# Patient Record
Sex: Female | Born: 1980 | Race: Asian | Hispanic: No | Marital: Married | Smoking: Never smoker
Health system: Southern US, Community
[De-identification: ages and names within clinical notes are randomized; demographics above are authoritative.]

## PROBLEM LIST (undated history)

## (undated) DIAGNOSIS — Z789 Other specified health status: Secondary | ICD-10-CM

## (undated) DIAGNOSIS — M2550 Pain in unspecified joint: Secondary | ICD-10-CM

## (undated) DIAGNOSIS — M25469 Effusion, unspecified knee: Secondary | ICD-10-CM

## (undated) DIAGNOSIS — J45909 Unspecified asthma, uncomplicated: Secondary | ICD-10-CM

## (undated) DIAGNOSIS — M25429 Effusion, unspecified elbow: Secondary | ICD-10-CM

## (undated) DIAGNOSIS — F419 Anxiety disorder, unspecified: Secondary | ICD-10-CM

## (undated) DIAGNOSIS — Z975 Presence of (intrauterine) contraceptive device: Secondary | ICD-10-CM

## (undated) DIAGNOSIS — T7840XA Allergy, unspecified, initial encounter: Secondary | ICD-10-CM

## (undated) HISTORY — DX: Allergy, unspecified, initial encounter: T78.40XA

## (undated) HISTORY — DX: Presence of (intrauterine) contraceptive device: Z97.5

## (undated) HISTORY — PX: NO PAST SURGERIES: SHX2092

## (undated) HISTORY — PX: LASIK: SHX215

---

## 2006-09-15 ENCOUNTER — Emergency Department: Payer: Self-pay | Admitting: Emergency Medicine

## 2006-09-25 ENCOUNTER — Ambulatory Visit: Payer: Self-pay | Admitting: Family Medicine

## 2007-07-30 ENCOUNTER — Observation Stay: Payer: Self-pay

## 2010-07-26 ENCOUNTER — Emergency Department: Payer: Self-pay | Admitting: Emergency Medicine

## 2013-06-05 ENCOUNTER — Emergency Department: Payer: Self-pay | Admitting: Emergency Medicine

## 2014-10-21 ENCOUNTER — Ambulatory Visit (INDEPENDENT_AMBULATORY_CARE_PROVIDER_SITE_OTHER): Payer: No Typology Code available for payment source | Admitting: Family Medicine

## 2014-12-06 ENCOUNTER — Encounter: Payer: Self-pay | Admitting: Family Medicine

## 2014-12-06 ENCOUNTER — Ambulatory Visit (INDEPENDENT_AMBULATORY_CARE_PROVIDER_SITE_OTHER): Payer: No Typology Code available for payment source | Admitting: Family Medicine

## 2014-12-06 VITALS — BP 100/62 | HR 68 | Ht 62.0 in | Wt 133.0 lb

## 2014-12-06 DIAGNOSIS — J0100 Acute maxillary sinusitis, unspecified: Secondary | ICD-10-CM | POA: Diagnosis not present

## 2014-12-06 MED ORDER — AMOXICILLIN 500 MG PO CAPS: 500.0000 mg | ORAL_CAPSULE | Freq: Three times a day (TID) | ORAL | Status: DC

## 2014-12-06 NOTE — Progress Notes (Addendum)
Name: Krystal Hardy   MRN: [id number]    DOB: 1980-09-17   Date:12/06/2014       Progress Note  Subjective  Chief Complaint  Chief Complaint  Patient presents with  . Sinusitis    cheek pressure, headaches    Sinusitis This is a new problem. The current episode started in the past 7 days. The problem has been gradually worsening since onset. The maximum temperature recorded prior to her arrival was 100.4 - 100.9 F. The fever has been present for 1 to 2 days. The pain is mild. Associated symptoms include congestion, headaches and a sore throat. Pertinent negatives include no chills, coughing, diaphoresis, ear pain, hoarse voice, neck pain, shortness of breath, sinus pressure, sneezing or swollen glands. Past treatments include acetaminophen. The treatment provided mild relief.    No problem-specific assessment & plan notes found for this encounter.   History reviewed. No pertinent past medical history.  History reviewed. No pertinent past surgical history.  History reviewed. No pertinent family history.  Social History   Social History  . Marital Status: Married    Spouse Name: N/A  . Number of Children: N/A  . Years of Education: N/A   Occupational History  . Not on file.   Social History Main Topics  . Smoking status: Never Smoker   . Smokeless tobacco: Not on file  . Alcohol Use: No  . Drug Use: No  . Sexual Activity: Yes   Other Topics Concern  . Not on file   Social History Narrative  . No narrative on file    No Known Allergies   Review of Systems  Constitutional: Negative for fever, chills, weight loss, malaise/fatigue and diaphoresis.  HENT: Positive for congestion and sore throat. Negative for ear discharge, ear pain, hoarse voice, sinus pressure and sneezing.   Eyes: Negative for blurred vision.  Respiratory: Negative for cough, sputum production, shortness of breath and wheezing.   Cardiovascular: Negative for chest pain, palpitations and leg swelling.   Gastrointestinal: Negative for heartburn, nausea, abdominal pain, diarrhea, constipation, blood in stool and melena.  Genitourinary: Negative for dysuria, urgency, frequency and hematuria.  Musculoskeletal: Negative for myalgias, back pain, joint pain and neck pain.  Skin: Negative for rash.  Neurological: Positive for headaches. Negative for dizziness, tingling, sensory change and focal weakness.  Endo/Heme/Allergies: Negative for environmental allergies and polydipsia. Does not bruise/bleed easily.  Psychiatric/Behavioral: Negative for depression and suicidal ideas. The patient is not nervous/anxious and does not have insomnia.      Objective  Filed Vitals:   12/06/14 1537  BP: 100/62  Pulse: 68  Height: 5\' 2"  (1.575 m)  Weight: 133 lb (60.328 kg)    Physical Exam  Constitutional: She is well-developed, well-nourished, and in no distress. No distress.  HENT:  Head: Normocephalic and atraumatic.  Right Ear: External ear normal.  Left Ear: External ear normal.  Nose: Nose normal.  Mouth/Throat: Oropharynx is clear and moist.  Eyes: Conjunctivae and EOM are normal. Pupils are equal, round, and reactive to light. Right eye exhibits no discharge. Left eye exhibits no discharge.  Neck: Normal range of motion. Neck supple. No JVD present. No thyromegaly present.  Cardiovascular: Normal rate, regular rhythm, normal heart sounds and intact distal pulses.  Exam reveals no gallop and no friction rub.   No murmur heard. Pulmonary/Chest: Effort normal and breath sounds normal.  Abdominal: Soft. Bowel sounds are normal. She exhibits no mass. There is no tenderness. There is no guarding.  Musculoskeletal: Normal  range of motion. She exhibits no edema.  Lymphadenopathy:    She has no cervical adenopathy.  Neurological: She is alert. She has normal reflexes.  Skin: Skin is warm and dry. She is not diaphoretic.  Psychiatric: Mood and affect normal.      Assessment & Plan  Problem List  Items Addressed This Visit    None    Visit Diagnoses    Acute maxillary sinusitis, recurrence not specified    -  Primary    Relevant Medications    amoxicillin (AMOXIL) 500 MG capsule         Dr. Macon [facility]  12/06/2014

## 2015-03-21 ENCOUNTER — Encounter: Payer: Self-pay | Admitting: Family Medicine

## 2015-03-21 ENCOUNTER — Ambulatory Visit (INDEPENDENT_AMBULATORY_CARE_PROVIDER_SITE_OTHER): Payer: No Typology Code available for payment source | Admitting: Family Medicine

## 2015-03-21 VITALS — BP 100/70 | HR 64 | Ht 62.0 in | Wt 138.0 lb

## 2015-03-21 DIAGNOSIS — R935 Abnormal findings on diagnostic imaging of other abdominal regions, including retroperitoneum: Secondary | ICD-10-CM

## 2015-03-21 DIAGNOSIS — R1011 Right upper quadrant pain: Secondary | ICD-10-CM

## 2015-03-21 NOTE — Progress Notes (Signed)
Name: Krystal Hardy   MRN: [id number]    DOB: 07-11-1980   Date:03/21/2015       Progress Note  Subjective  Chief Complaint  Chief Complaint  Patient presents with  . Follow-up    ER- abdominal pain- no pain now    Abdominal Pain This is a new problem. The current episode started in the past 7 days. The onset quality is gradual. The problem has been gradually improving. The pain is located in the RUQ. The pain is at a severity of 4/10. The pain is moderate. The quality of the pain is aching. The abdominal pain does not radiate. Pertinent negatives include no anorexia, arthralgias, belching, constipation, diarrhea, dysuria, fever, flatus, frequency, headaches, hematochezia, hematuria, melena, myalgias, nausea, vomiting or weight loss. Nothing aggravates the pain. The pain is relieved by nothing. She has tried nothing for the symptoms. There is no history of abdominal surgery, colon cancer or GERD.    No problem-specific assessment & plan notes found for this encounter.   Past Medical History  Diagnosis Date  . Allergy     History reviewed. No pertinent past surgical history.  History reviewed. No pertinent family history.  Social History   Social History  . Marital Status: Married    Spouse Name: N/A  . Number of Children: N/A  . Years of Education: N/A   Occupational History  . Not on file.   Social History Main Topics  . Smoking status: Never Smoker   . Smokeless tobacco: Not on file  . Alcohol Use: No  . Drug Use: No  . Sexual Activity: Yes   Other Topics Concern  . Not on file   Social History Narrative    No Known Allergies   Review of Systems  Constitutional: Negative for fever, chills, weight loss and malaise/fatigue.  HENT: Negative for ear discharge, ear pain and sore throat.   Eyes: Negative for blurred vision.  Respiratory: Negative for cough, sputum production, shortness of breath and wheezing.   Cardiovascular: Negative for chest pain, palpitations  and leg swelling.  Gastrointestinal: Positive for abdominal pain. Negative for heartburn, nausea, vomiting, diarrhea, constipation, blood in stool, melena, hematochezia, anorexia and flatus.  Genitourinary: Negative for dysuria, urgency, frequency and hematuria.  Musculoskeletal: Negative for myalgias, back pain, joint pain, arthralgias and neck pain.  Skin: Negative for rash.  Neurological: Negative for dizziness, tingling, sensory change, focal weakness and headaches.  Endo/Heme/Allergies: Negative for environmental allergies and polydipsia. Does not bruise/bleed easily.  Psychiatric/Behavioral: Negative for depression and suicidal ideas. The patient is not nervous/anxious and does not have insomnia.      Objective  Filed Vitals:   03/21/15 1523  BP: 100/70  Pulse: 64  Height: 5\' 2"  (1.575 m)  Weight: 138 lb (62.596 kg)    Physical Exam  Constitutional: She is well-developed, well-nourished, and in no distress. No distress.  HENT:  Head: Normocephalic and atraumatic.  Right Ear: External ear normal.  Left Ear: External ear normal.  Nose: Nose normal.  Mouth/Throat: Oropharynx is clear and moist.  Eyes: Conjunctivae and EOM are normal. Pupils are equal, round, and reactive to light. Right eye exhibits no discharge. Left eye exhibits no discharge.  Neck: Normal range of motion. Neck supple. No JVD present. No thyromegaly present.  Cardiovascular: Normal rate, regular rhythm, normal heart sounds and intact distal pulses.  Exam reveals no gallop and no friction rub.   No murmur heard. Pulmonary/Chest: Effort normal and breath sounds normal.  Abdominal: Soft. Bowel sounds  are normal. She exhibits no mass. There is no tenderness. There is no guarding.  Musculoskeletal: Normal range of motion. She exhibits no edema.  Lymphadenopathy:    She has no cervical adenopathy.  Neurological: She is alert. She has normal reflexes.  Skin: Skin is warm and dry. She is not diaphoretic.   Psychiatric: Mood and affect normal.      Assessment & Plan  Problem List Items Addressed This Visit    None    Visit Diagnoses    Right upper quadrant pain    -  Primary    Relevant Orders    Ambulatory referral to Oncology    Abnormal CT of the abdomen        referral oncology/ Wed.  8:15    Relevant Orders    Ambulatory referral to Oncology         Dr. Otilio Miu [facility]  03/21/2015

## 2015-03-23 ENCOUNTER — Encounter: Payer: Self-pay | Admitting: Oncology

## 2015-03-23 ENCOUNTER — Other Ambulatory Visit: Payer: Self-pay | Admitting: Cardiothoracic Surgery

## 2015-03-23 ENCOUNTER — Inpatient Hospital Stay: Payer: No Typology Code available for payment source | Attending: Oncology | Admitting: Oncology

## 2015-03-23 ENCOUNTER — Inpatient Hospital Stay: Payer: No Typology Code available for payment source

## 2015-03-23 ENCOUNTER — Encounter: Payer: Self-pay | Admitting: *Deleted

## 2015-03-23 ENCOUNTER — Inpatient Hospital Stay
Admission: RE | Admit: 2015-03-23 | Discharge: 2015-03-23 | Disposition: A | Discharge status: Home / Self Care | Payer: Self-pay | Source: Ambulatory Visit | Attending: Cardiothoracic Surgery | Admitting: Cardiothoracic Surgery

## 2015-03-23 VITALS — BP 113/71 | HR 88 | Temp 96.5°F | Wt 135.8 lb

## 2015-03-23 DIAGNOSIS — R1909 Other intra-abdominal and pelvic swelling, mass and lump: Secondary | ICD-10-CM | POA: Insufficient documentation

## 2015-03-23 DIAGNOSIS — R590 Localized enlarged lymph nodes: Secondary | ICD-10-CM | POA: Diagnosis not present

## 2015-03-23 DIAGNOSIS — R935 Abnormal findings on diagnostic imaging of other abdominal regions, including retroperitoneum: Secondary | ICD-10-CM | POA: Insufficient documentation

## 2015-03-23 LAB — LACTATE DEHYDROGENASE: LDH: 127 U/L (ref 98–192)

## 2015-03-23 LAB — CBC WITH DIFFERENTIAL/PLATELET
BASOS ABS: 0 10*3/uL (ref 0–0.1)
BASOS PCT: 1 %
EOS ABS: 0.1 10*3/uL (ref 0–0.7)
Eosinophils Relative: 1 %
HCT: 40.1 % (ref 35.0–47.0)
HEMOGLOBIN: 13.8 g/dL (ref 12.0–16.0)
Lymphocytes Relative: 26 %
Lymphs Abs: 1.1 10*3/uL (ref 1.0–3.6)
MCH: 30.6 pg (ref 26.0–34.0)
MCHC: 34.5 g/dL (ref 32.0–36.0)
MCV: 88.6 fL (ref 80.0–100.0)
MONOS PCT: 14 %
Monocytes Absolute: 0.6 10*3/uL (ref 0.2–0.9)
NEUTROS PCT: 58 %
Neutro Abs: 2.5 10*3/uL (ref 1.4–6.5)
Platelets: 306 10*3/uL (ref 150–440)
RBC: 4.52 MIL/uL (ref 3.80–5.20)
RDW: 12.5 % (ref 11.5–14.5)
WBC: 4.3 10*3/uL (ref 3.6–11.0)

## 2015-03-23 LAB — SEDIMENTATION RATE: Sed Rate: 37 mm/hr — ABNORMAL HIGH (ref 0–20)

## 2015-03-23 MED ORDER — HYDROCODONE-ACETAMINOPHEN 5-325 MG PO TABS: 1.0000 | ORAL_TABLET | Freq: Four times a day (QID) | ORAL | Status: DC | PRN

## 2015-03-23 NOTE — Progress Notes (Signed)
Krystal Hardy @ [facility] Telephone:(336) [phone]  Fax:(336) [phone]   INITIAL CONSULT  Krystal Hardy OB: Jan 08, 1981  MR#: [id number]  [id number]  Patient Care Team: Juline Patch, MD as PCP - General (Family Medicine)  CHIEF COMPLAINT: Oncology history Chief Complaint  Patient presents with  . New Evaluation   patient had an abnormal CT scan of abdomen.  There is solid appearing mass in the lesser curvature of the stomach measuring 2.8 x 2.1 cm.  There are retroperitoneal and pelvic adenopathy.  Retroperitoneal lymph nodes include (CT scan was done at [facility] in December of 2016)  Aortic lymph nodes.  The right external iliac node.  Left external iliac node.  VISIT DIAGNOSIS:     ICD-9-CM ICD-10-CM   1. Abdominal lymphadenopathy 785.6 R59.0 cetirizine (ZYRTEC) 10 MG tablet     HYDROcodone-acetaminophen (NORCO/VICODIN) 5-325 MG tablet     CBC with Differential     Lactate dehydrogenase     Sedimentation rate     NM PET Image Initial (PI) Skull Base To Thigh     CANCELED: NM PET Image Initial (PI) Skull Base To Thigh      No history exists.    No flowsheet data found.  INTERVAL HISTORY: 34 year old lady came today further follow-up.  Patient is having abdominal pain and discomfort patient went to [facility] emergency room where a CT scan was done which was abnormal patient was referred to me for further evaluation and treatment consideration. He should not is getting regular menstrual cycle.  Has IUD. As history of one pregnancy. REVIEW OF SYSTEMS:   GENERAL:  Feels good.  Active.  No fevers, sweats or weight loss. PERFORMANCE STATUS (ECOG): 0 HEENT:  No visual changes, runny nose, sore throat, mouth sores or tenderness. Lungs: No shortness of breath or cough.  No hemoptysis. Cardiac:  No chest pain, palpitations, orthopnea, or PND. GI: increasing abdominal pain and discomfort. GU:  No urgency, frequency, dysuria, or hematuria. Musculoskeletal:  No back pain.  No joint pain.   No muscle tenderness. Extremities:  No pain or swelling. Skin:  No rashes or skin changes. Neuro:  No headache, numbness or weakness, balance or coordination issues. Endocrine:  No diabetes, thyroid issues, hot flashes or night sweats. Psych:  No mood changes, depression or anxiety. Pain:  No focal pain. Review of systems:  All other systems reviewed and found to be negative.  As per HPI. Otherwise, a complete review of systems is negatve.  PAST MEDICAL HISTORY: Past Medical History  Diagnosis Date  . Allergy     PAST SURGICAL HISTORY: No significant past medical history.  patient has IUD.  one pregnancy FAMILY HISTORY There is no significant family history of, ovarian cancer, colon cancer E8 family history of breast cancer. GYNECOLOGIC HISTORY:  Regular menstrual cycle   ADVANCED DIRECTIVES:   Patient does not have any living will or healthcare power of attorney.  Information was given .  Available resources had been discussed.  We will follow-up on subsequent appointments regarding this issue HEALTH MAINTENANCE: Social History  Substance Use Topics  . Smoking status: Never Smoker   . Smokeless tobacco: None  . Alcohol Use: No      No Known Allergies  Current Outpatient Prescriptions  Medication Sig Dispense Refill  . cetirizine (ZYRTEC) 10 MG tablet Take 10 mg by mouth daily.    Marland Kitchen HYDROcodone-acetaminophen (NORCO/VICODIN) 5-325 MG tablet Take 1 tablet by mouth every 6 (six) hours as needed for moderate pain. 30 tablet  0  . promethazine (PHENERGAN) 12.5 MG tablet      No current facility-administered medications for this visit.    OBJECTIVE: PHYSICAL EXAM: GENERAL:  Well developed, well nourished, sitting comfortably in the exam room in no acute distress. MENTAL STATUS:  Alert and oriented to person, place and time.  conjunctivitis or scleral icterus. ENT:  Oropharynx clear without lesion.  Tongue normal. Mucous membranes moist.  RESPIRATORY:  Clear to  auscultation without rales, wheezes or rhonchi. CARDIOVASCULAR:  Regular rate and rhythm without murmur, rub or gallop. BREAST:  Right breast without masses, skin changes or nipple discharge.  Left breast without masses, skin changes or nipple discharge. ABDOMEN:  Soft, non-tender, with active bowel sounds, and no hepatosplenomegaly.  No masses. BACK:  No CVA tenderness.  No tenderness on percussion of the back or rib cage. SKIN:  No rashes, ulcers or lesions. EXTREMITIES: No edema, no skin discoloration or tenderness.  No palpable cords. LYMPH NODES: No palpable cervical, supraclavicular, axillary or inguinal adenopathy  NEUROLOGICAL: Unremarkable. PSYCH:  Appropriate.e.  Filed Vitals:   03/23/15 0829  BP: 113/71  Pulse: 88  Temp: 96.5 F (35.8 C)     Body mass index is 24.83 kg/(m^2).    ECOG FS:0 - Asymptomatic  LAB RESULTS:  Lab data from [facility] has been reviewed.  No abnormality detected.  Lab data from CBC LDH and sedimentation rate has been ordered today  STUDIES: CT scan report has been reviewed from [facility].  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  There is no palpable lymph node outside that can be biopsied.  A PET scan has been ordered.  Possibility of doing upper endoscopy is being considered.  If he gets diagnosis through upper endoscopy we may not have to do any explorative laparotomy: Needle biopsy of retroperitoneal lymph node. 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 PLAN:    Upper endoscopy or an ultrasound guided upper endoscopy and biopsy  PET scan  Review CT scan  Discuss case in tumor conference .Marland Kitchen  Patient does not have any significant comorbid condition   Patient expressed understanding and was in agreement with this plan. She also understands that She can call  clinic at any time with any questions, concerns, or complaints.    No matching staging information was found for the patient.  [name], MD   03/23/2015 9:36 AM

## 2015-03-23 NOTE — Progress Notes (Signed)
  Oncology Nurse Navigator Documentation  Referral date to RadOnc/MedOnc: 03/23/15 (03/23/15 1100) Navigator Encounter Type: Initial MedOnc (03/23/15 1100) Patient Visit Type: Medonc (03/23/15 1100) Treatment Phase: Abnormal Scans (03/23/15 1100) Barriers/Navigation Needs: No barriers at this time (03/23/15 1100)   Interventions: Coordination of Care (03/23/15 1100)   Coordination of Care: EUS (03/23/15 1100)        Time Spent with Patient: 60 (03/23/15 1100)   Met with patient in exam room after her consult with Dr Oliva Bustard. She is being arranged for an EUS 03/25/15 with Dr Francella Solian at [facility]. She denies any blood thinners. Will remain NPO after midnight except for clear liquids up to 8am. She will arrive at the medical mall entrance registration at 1230. She is scheduled at 1300. Labs completed at the cancer center today. She is aware that she must have a responsible driver stay with here at the hospital and to drive her home. She was given these instructions for EUS in writing. Appt for PET with instructions provided by scheduling along with follow up appt with Dr Oliva Bustard.

## 2015-03-23 NOTE — Progress Notes (Signed)
Patient here today as new evaluation for stomach mass referred by Dr. Otilio Miu.

## 2015-03-24 ENCOUNTER — Ambulatory Visit: Payer: No Typology Code available for payment source | Admitting: Anesthesiology

## 2015-03-24 ENCOUNTER — Ambulatory Visit
Admission: RE | Admit: 2015-03-24 | Discharge: 2015-03-24 | Disposition: A | Discharge status: Home / Self Care | Payer: No Typology Code available for payment source | Source: Ambulatory Visit | Attending: Gastroenterology | Admitting: Gastroenterology

## 2015-03-24 ENCOUNTER — Encounter
Admission: RE | Disposition: A | Discharge status: Home / Self Care | Payer: Self-pay | Source: Ambulatory Visit | Attending: Gastroenterology

## 2015-03-24 DIAGNOSIS — K319 Disease of stomach and duodenum, unspecified: Secondary | ICD-10-CM | POA: Insufficient documentation

## 2015-03-24 DIAGNOSIS — R590 Localized enlarged lymph nodes: Secondary | ICD-10-CM | POA: Insufficient documentation

## 2015-03-24 DIAGNOSIS — R933 Abnormal findings on diagnostic imaging of other parts of digestive tract: Secondary | ICD-10-CM | POA: Insufficient documentation

## 2015-03-24 DIAGNOSIS — R1013 Epigastric pain: Secondary | ICD-10-CM | POA: Diagnosis not present

## 2015-03-24 DIAGNOSIS — Z79899 Other long term (current) drug therapy: Secondary | ICD-10-CM | POA: Insufficient documentation

## 2015-03-24 DIAGNOSIS — R109 Unspecified abdominal pain: Secondary | ICD-10-CM | POA: Diagnosis present

## 2015-03-24 HISTORY — PX: UPPER ESOPHAGEAL ENDOSCOPIC ULTRASOUND (EUS): SHX6562

## 2015-03-24 HISTORY — DX: Other specified health status: Z78.9

## 2015-03-24 LAB — POCT PREGNANCY, URINE: PREG TEST UR: NEGATIVE

## 2015-03-24 SURGERY — UPPER ESOPHAGEAL ENDOSCOPIC ULTRASOUND (EUS)
Anesthesia: General

## 2015-03-24 MED ORDER — MIDAZOLAM HCL 2 MG/2ML IJ SOLN
INTRAMUSCULAR | Status: DC | PRN
  Administered 2015-03-24: 1 mg via INTRAVENOUS

## 2015-03-24 MED ORDER — PROPOFOL 500 MG/50ML IV EMUL
INTRAVENOUS | Status: DC | PRN
  Administered 2015-03-24: 75 ug/kg/min via INTRAVENOUS

## 2015-03-24 MED ORDER — SODIUM CHLORIDE 0.9 % IV SOLN
INTRAVENOUS | Status: DC
Start: 2015-03-24 — End: 2015-03-24
  Administered 2015-03-24: 1000 mL via INTRAVENOUS

## 2015-03-24 MED ORDER — LACTATED RINGERS IV SOLN
INTRAVENOUS | Status: DC | PRN
  Administered 2015-03-24: 13:00:00 via INTRAVENOUS

## 2015-03-24 MED ORDER — FENTANYL CITRATE (PF) 100 MCG/2ML IJ SOLN
INTRAMUSCULAR | Status: DC | PRN
  Administered 2015-03-24: 50 ug via INTRAVENOUS

## 2015-03-24 MED ORDER — LIDOCAINE HCL (CARDIAC) 20 MG/ML IV SOLN
INTRAVENOUS | Status: DC | PRN
  Administered 2015-03-24: 10 mg via INTRAVENOUS

## 2015-03-24 MED ORDER — KETOROLAC TROMETHAMINE 30 MG/ML IJ SOLN
INTRAMUSCULAR | Status: DC | PRN
  Administered 2015-03-24: 30 mg via INTRAVENOUS

## 2015-03-24 NOTE — H&P (View-Only) (Signed)
[place] @ [facility] Telephone:(336) [phone]  Fax:(336) [phone]   INITIAL CONSULT  Ragini Mehlhaff OB: 1980-04-22  MR#: [id number]  [id number]  Patient Care Team: Juline Patch, MD as PCP - General (Family Medicine)  CHIEF COMPLAINT: Oncology history Chief Complaint  Patient presents with  . New Evaluation   patient had an abnormal CT scan of abdomen.  There is solid appearing mass in the lesser curvature of the stomach measuring 2.8 x 2.1 cm.  There are retroperitoneal and pelvic adenopathy.  Retroperitoneal lymph nodes include (CT scan was done at [facility] in December of 2016)  Aortic lymph nodes.  The right external iliac node.  Left external iliac node.  VISIT DIAGNOSIS:     ICD-9-CM ICD-10-CM   1. Abdominal lymphadenopathy 785.6 R59.0 cetirizine (ZYRTEC) 10 MG tablet     HYDROcodone-acetaminophen (NORCO/VICODIN) 5-325 MG tablet     CBC with Differential     Lactate dehydrogenase     Sedimentation rate     NM PET Image Initial (PI) Skull Base To Thigh     CANCELED: NM PET Image Initial (PI) Skull Base To Thigh      No history exists.    No flowsheet data found.  INTERVAL HISTORY: 34 year old lady came today further follow-up.  Patient is having abdominal pain and discomfort patient went to [facility] emergency room where a CT scan was done which was abnormal patient was referred to me for further evaluation and treatment consideration. He should not is getting regular menstrual cycle.  Has IUD. As history of one pregnancy. REVIEW OF SYSTEMS:   GENERAL:  Feels good.  Active.  No fevers, sweats or weight loss. PERFORMANCE STATUS (ECOG): 0 HEENT:  No visual changes, runny nose, sore throat, mouth sores or tenderness. Lungs: No shortness of breath or cough.  No hemoptysis. Cardiac:  No chest pain, palpitations, orthopnea, or PND. GI: increasing abdominal pain and discomfort. GU:  No urgency, frequency, dysuria, or hematuria. Musculoskeletal:  No back pain.  No joint pain.   No muscle tenderness. Extremities:  No pain or swelling. Skin:  No rashes or skin changes. Neuro:  No headache, numbness or weakness, balance or coordination issues. Endocrine:  No diabetes, thyroid issues, hot flashes or night sweats. Psych:  No mood changes, depression or anxiety. Pain:  No focal pain. Review of systems:  All other systems reviewed and found to be negative.  As per HPI. Otherwise, a complete review of systems is negatve.  PAST MEDICAL HISTORY: Past Medical History  Diagnosis Date  . Allergy     PAST SURGICAL HISTORY: No significant past medical history.  patient has IUD.  one pregnancy FAMILY HISTORY There is no significant family history of, ovarian cancer, colon cancer E8 family history of breast cancer. GYNECOLOGIC HISTORY:  Regular menstrual cycle   ADVANCED DIRECTIVES:   Patient does not have any living will or healthcare power of attorney.  Information was given .  Available resources had been discussed.  We will follow-up on subsequent appointments regarding this issue HEALTH MAINTENANCE: Social History  Substance Use Topics  . Smoking status: Never Smoker   . Smokeless tobacco: None  . Alcohol Use: No      No Known Allergies  Current Outpatient Prescriptions  Medication Sig Dispense Refill  . cetirizine (ZYRTEC) 10 MG tablet Take 10 mg by mouth daily.    Marland Kitchen HYDROcodone-acetaminophen (NORCO/VICODIN) 5-325 MG tablet Take 1 tablet by mouth every 6 (six) hours as needed for moderate pain. 30 tablet  0  . promethazine (PHENERGAN) 12.5 MG tablet      No current facility-administered medications for this visit.    OBJECTIVE: PHYSICAL EXAM: GENERAL:  Well developed, well nourished, sitting comfortably in the exam room in no acute distress. MENTAL STATUS:  Alert and oriented to person, place and time.  conjunctivitis or scleral icterus. ENT:  Oropharynx clear without lesion.  Tongue normal. Mucous membranes moist.  RESPIRATORY:  Clear to  auscultation without rales, wheezes or rhonchi. CARDIOVASCULAR:  Regular rate and rhythm without murmur, rub or gallop. BREAST:  Right breast without masses, skin changes or nipple discharge.  Left breast without masses, skin changes or nipple discharge. ABDOMEN:  Soft, non-tender, with active bowel sounds, and no hepatosplenomegaly.  No masses. BACK:  No CVA tenderness.  No tenderness on percussion of the back or rib cage. SKIN:  No rashes, ulcers or lesions. EXTREMITIES: No edema, no skin discoloration or tenderness.  No palpable cords. LYMPH NODES: No palpable cervical, supraclavicular, axillary or inguinal adenopathy  NEUROLOGICAL: Unremarkable. PSYCH:  Appropriate.e.  Filed Vitals:   03/23/15 0829  BP: 113/71  Pulse: 88  Temp: 96.5 F (35.8 C)     Body mass index is 24.83 kg/(m^2).    ECOG FS:0 - Asymptomatic  LAB RESULTS:  Lab data from [facility] has been reviewed.  No abnormality detected.  Lab data from CBC LDH and sedimentation rate has been ordered today  STUDIES: CT scan report has been reviewed from [facility].  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  There is no palpable lymph node outside that can be biopsied.  A PET scan has been ordered.  Possibility of doing upper endoscopy is being considered.  If he gets diagnosis through upper endoscopy we may not have to do any explorative laparotomy: Needle biopsy of retroperitoneal lymph node. 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 PLAN:    Upper endoscopy or an ultrasound guided upper endoscopy and biopsy  PET scan  Review CT scan  Discuss case in tumor conference .Marland Kitchen  Patient does not have any significant comorbid condition   Patient expressed understanding and was in agreement with this plan. She also understands that She can call  clinic at any time with any questions, concerns, or complaints.    No matching staging information was found for the patient.  [name], MD   03/23/2015 9:36 AM

## 2015-03-24 NOTE — Anesthesia Preprocedure Evaluation (Signed)
Anesthesia Evaluation  Patient identified by MRN, date of birth, ID band Patient awake    Reviewed: Allergy & Precautions, NPO status , Patient's Chart, lab work & pertinent test results  History of Anesthesia Complications Negative for: history of anesthetic complications  Airway Mallampati: II       Dental  (+) Teeth Intact   Pulmonary neg pulmonary ROS,    breath sounds clear to auscultation       Cardiovascular Exercise Tolerance: Good negative cardio ROS   Rhythm:Regular     Neuro/Psych    GI/Hepatic negative GI ROS, Neg liver ROS,   Endo/Other  negative endocrine ROS  Renal/GU negative Renal ROS     Musculoskeletal   Abdominal Normal abdominal exam  (+)   Peds  Hematology negative hematology ROS (+)   Anesthesia Other Findings   Reproductive/Obstetrics                             Anesthesia Physical Anesthesia Plan  ASA: I  Anesthesia Plan: General   Post-op Pain Management:    Induction: Intravenous  Airway Management Planned: Nasal Cannula  Additional Equipment:   Intra-op Plan:   Post-operative Plan:   Informed Consent: I have reviewed the patients History and Physical, chart, labs and discussed the procedure including the risks, benefits and alternatives for the proposed anesthesia with the patient or authorized representative who has indicated his/her understanding and acceptance.     Plan Discussed with: CRNA  Anesthesia Plan Comments:         Anesthesia Quick Evaluation

## 2015-03-24 NOTE — Transfer of Care (Signed)
Immediate Anesthesia Transfer of Care Note  Patient: Krystal Hardy  Procedure(s) Performed: Procedure(s): UPPER ESOPHAGEAL ENDOSCOPIC ULTRASOUND (EUS) (N/A)  Patient Location: PACU and Endoscopy Unit  Anesthesia Type:General  Level of Consciousness: awake, alert  and oriented  Airway & Oxygen Therapy: Patient Spontanous Breathing and Patient connected to nasal cannula oxygen  Post-op Assessment: Report given to RN and Post -op Vital signs reviewed and stable  Post vital signs: Reviewed and stable  Last Vitals:  Filed Vitals:   03/24/15 1247  BP: 98/68  Resp: 20    Complications: No apparent anesthesia complications

## 2015-03-24 NOTE — Interval H&P Note (Signed)
History and Physical Interval Note:  03/24/2015 1:14 PM  Krystal Hardy  has presented today for surgery, with the diagnosis of STOMACH MASS  The various methods of treatment have been discussed with the patient and family. After consideration of risks, benefits and other options for treatment, the patient has consented to  Procedure(s): UPPER ESOPHAGEAL ENDOSCOPIC ULTRASOUND (EUS) (N/A) as a surgical intervention .  The patient's history has been reviewed, patient examined - abd soft non tender, no change in status, stable for surgery.  I have reviewed the patient's chart and labs.  Questions were answered to the patient's satisfaction.     Zada Girt

## 2015-03-24 NOTE — Op Note (Signed)
[facility] Gastroenterology Patient Name: Krystal Hardy Procedure Date: 03/24/2015 1:19 PM MRN: [id number] Account #: [id number] Date of Birth: [birth year] Admit Type: Outpatient Age: 34 Room: [facility] ENDO ROOM 3 Gender: Female Note Status: Finalized Procedure:         Upper EUS Indications:       Suspected mass on abdominal/pelvic CT scan, Epigastric                     abdominal pain Providers:         Zada Girt Referring MD:      Juline Patch, MD (Referring MD), Martie Lee. Choksi, MD                     (Referring MD) Medicines:         Monitored Anesthesia Care Complications:     No immediate complications. Procedure:         Pre-Anesthesia Assessment:                    - Please see pre-anesthesia assessment documentation                     already completed in Epic.                    After obtaining informed consent, the endoscope was passed                     under direct vision. Throughout the procedure, the                     patient's blood pressure, pulse, and oxygen saturations                     were monitored continuously. The EUS GI Linear Array                     [id number] was introduced through the mouth, and advanced to                     the duodenum for ultrasound examination from the                     esophagus, stomach and duodenum. The Endoscope was                     introduced through the mouth, and advanced to the second                     part of duodenum. The upper EUS was accomplished without                     difficulty. The patient tolerated the procedure well. Findings:      Endoscopic Finding :      The examined esophagus was endoscopically normal.      The entire examined stomach was endoscopically normal.      The examined duodenum was endoscopically normal.      Endosonographic Finding :      One abnormal ? lymph node was visualized in the gastrohepatic ligament       (level 18). It measured 29 mm by 13 mm in  maximal cross-sectional       diameter. The node was oval, heterogenous and had poorly defined  margins. Fine needle biopsy was performed. Color Doppler imaging was       utilized prior to needle puncture to confirm a lack of significant       vascular structures within the needle path. Six passes were made with       the 22 gauge ultrasound biopsy needle using a transgastric approach. A       visible core of tissue was obtained. A preliminary cytologic examination       was performed. Final cytology results are pending.      No abnormal-appearing lymph nodes were seen during endosonographic       examination in the celiac region (level 20) and in the porta hepatis       region.      Endosonographic imaging in the left lobe of the liver showed no lesion. Impression:        - Normal esophagus.                    - Normal stomach.                    - Normal examined duodenum.                    EUS:                    - Lesion seen next to the stomach in the gastrohepatic                     ligament area - probably a lymph node but other lesions                     arising in this area including a spindle cell lesion are                     possible. It is immediately adjacent to the outer border                     of the gastric wall and although it does not appear to be                     arising from the gastric wall this cannot be definitively                     ruled out. Fine needle biopsy performed and material                     submitted in formalin and separately for lymphoma                     evaluation. Recommendation:    - Await cytology results.                    - Return to referring physician.                    - The findings and recommendations were discussed with the                     patient.                    - The findings and recommendations were discussed with the  designated responsible adult. Procedure Code(s): ---  Professional ---                    [phone], Esophagogastroduodenoscopy, flexible, transoral;                     with transendoscopic ultrasound-guided intramural or                     transmural fine needle aspiration/biopsy(s), (includes                     endoscopic ultrasound examination limited to the                     esophagus, stomach or duodenum, and adjacent structures) Diagnosis Code(s): --- Professional ---                    R10.13, Epigastric pain                    R93.5, Abnormal findings on diagnostic imaging of other                     abdominal regions, including retroperitoneum                    I89.9, Noninfective disorder of lymphatic vessels and                     lymph nodes, unspecified CPT copyright 2014 American Medical Association. All rights reserved. The codes documented in this report are preliminary and upon coder review may  be revised to meet current compliance requirements. Zada Girt,  03/24/2015 2:11:52 PM This report has been signed electronically. Number of Addenda: 0 Note Initiated On: 03/24/2015 1:19 PM      [facility]

## 2015-03-24 NOTE — Anesthesia Postprocedure Evaluation (Signed)
Anesthesia Post Note  Patient: Jamiemarie Chowdhury  Procedure(s) Performed: Procedure(s) (LRB): UPPER ESOPHAGEAL ENDOSCOPIC ULTRASOUND (EUS) (N/A)  Patient location during evaluation: Endoscopy Anesthesia Type: General Level of consciousness: awake and alert Pain management: pain level controlled Vital Signs Assessment: post-procedure vital signs reviewed and stable Respiratory status: spontaneous breathing, nonlabored ventilation, respiratory function stable and patient connected to nasal cannula oxygen Cardiovascular status: blood pressure returned to baseline and stable Postop Assessment: no signs of nausea or vomiting Anesthetic complications: no    Last Vitals:  Filed Vitals:   03/24/15 1430 03/24/15 1439  BP: 94/61 84/55  Pulse: 66 66  Temp:    Resp: 16 17    Last Pain: There were no vitals filed for this visit.               Precious Haws Piscitello

## 2015-03-26 ENCOUNTER — Encounter: Payer: Self-pay | Admitting: Gastroenterology

## 2015-03-28 LAB — CYTOLOGY - NON PAP

## 2015-03-29 ENCOUNTER — Ambulatory Visit
Admission: RE | Admit: 2015-03-29 | Discharge: 2015-03-29 | Disposition: A | Discharge status: Home / Self Care | Payer: No Typology Code available for payment source | Source: Ambulatory Visit | Attending: Oncology | Admitting: Oncology

## 2015-03-29 DIAGNOSIS — R590 Localized enlarged lymph nodes: Secondary | ICD-10-CM | POA: Insufficient documentation

## 2015-03-29 DIAGNOSIS — E278 Other specified disorders of adrenal gland: Secondary | ICD-10-CM | POA: Diagnosis not present

## 2015-03-29 LAB — GLUCOSE, CAPILLARY: GLUCOSE-CAPILLARY: 75 mg/dL (ref 65–99)

## 2015-03-29 MED ORDER — FLUDEOXYGLUCOSE F - 18 (FDG) INJECTION
12.2700 | Freq: Once | INTRAVENOUS | Status: AC | PRN
Start: 2015-03-29 — End: 2015-03-29
  Administered 2015-03-29: 12.27 via INTRAVENOUS

## 2015-03-29 NOTE — Progress Notes (Signed)
  Oncology Nurse Navigator Documentation                           Time Spent with Patient: 15 (03/29/15 1500)   Pathology report from EUS biopsy forwarded to Dr Oliva Bustard.

## 2015-03-30 ENCOUNTER — Encounter: Payer: Self-pay | Admitting: Oncology

## 2015-03-30 ENCOUNTER — Inpatient Hospital Stay (HOSPITAL_BASED_OUTPATIENT_CLINIC_OR_DEPARTMENT_OTHER): Payer: No Typology Code available for payment source | Admitting: Oncology

## 2015-03-30 ENCOUNTER — Encounter: Payer: Self-pay | Admitting: *Deleted

## 2015-03-30 VITALS — BP 117/71 | HR 68 | Temp 97.8°F | Resp 18 | Wt 134.9 lb

## 2015-03-30 DIAGNOSIS — R590 Localized enlarged lymph nodes: Secondary | ICD-10-CM

## 2015-03-30 DIAGNOSIS — R935 Abnormal findings on diagnostic imaging of other abdominal regions, including retroperitoneum: Secondary | ICD-10-CM

## 2015-03-30 DIAGNOSIS — R1909 Other intra-abdominal and pelvic swelling, mass and lump: Secondary | ICD-10-CM | POA: Diagnosis not present

## 2015-03-30 NOTE — Progress Notes (Signed)
Patient here for PET results. 

## 2015-03-30 NOTE — Progress Notes (Signed)
[place] @ [facility] Telephone:([phone]) [phone]  Fax:(336) [phone]   INITIAL CONSULT  Krystal Hardy OB: 02-04-1981  MR#: [id number]  [id number]  Patient Care Team: Juline Patch, MD as PCP - General (Family Medicine)  CHIEF COMPLAINT: Oncology history Chief Complaint  Patient presents with  . Abdominal lymphadenopathy   patient had an abnormal CT scan of abdomen.  There is solid appearing mass in the lesser curvature of the stomach measuring 2.8 x 2.1 cm.  There are retroperitoneal and pelvic adenopathy.  Retroperitoneal lymph nodes include (CT scan was done at [facility] in December of 2016)  Aortic lymph nodes.  The right external iliac node.  Left external iliac node.  VISIT DIAGNOSIS:   No diagnosis found.    No history exists.    No flowsheet data found.  INTERVAL HISTORY: 34 year old lady came today further follow-up.  Patient is having abdominal pain and discomfort patient went to [facility] emergency room where a CT scan was done which was abnormal patient was referred to me for further evaluation and treatment consideration. He should not is getting regular menstrual cycle.  Has IUD. As history of one pregnancy. Patient had endoscopy, ultrasound guided biopsy in which revealed spindle cells but no conclusive evidence of diagnosis.  There was no evidence of monoclonal B-cell population. Pet  scan was done which has been reviewed independently.  REVIEW OF SYSTEMS:   GENERAL:  Feels good.  Active.  No fevers, sweats or weight loss. PERFORMANCE STATUS (ECOG): 0 HEENT:  No visual changes, runny nose, sore throat, mouth sores or tenderness. Lungs: No shortness of breath or cough.  No hemoptysis. Cardiac:  No chest pain, palpitations, orthopnea, or PND. GI: increasing abdominal pain and discomfort. GU:  No urgency, frequency, dysuria, or hematuria. Musculoskeletal:  No back pain.  No joint pain.  No muscle tenderness. Extremities:  No pain or swelling. Skin:  No rashes or  skin changes. Neuro:  No headache, numbness or weakness, balance or coordination issues. Endocrine:  No diabetes, thyroid issues, hot flashes or night sweats. Psych:  No mood changes, depression or anxiety. Pain:  No focal pain. Review of systems:  All other systems reviewed and found to be negative.  As per HPI. Otherwise, a complete review of systems is negatve.  PAST MEDICAL HISTORY: Past Medical History  Diagnosis Date  . Allergy   . Allergy   . Medical history non-contributory     PAST SURGICAL HISTORY: No significant past medical history.  patient has IUD.  one pregnancy FAMILY HISTORY There is no significant family history of, ovarian cancer, colon cancer E8 family history of breast cancer. GYNECOLOGIC HISTORY:  Regular menstrual cycle   ADVANCED DIRECTIVES:   Patient does not have any living will or healthcare power of attorney.  Information was given .  Available resources had been discussed.  We will follow-up on subsequent appointments regarding this issue HEALTH MAINTENANCE: Social History  Substance Use Topics  . Smoking status: Never Smoker   . Smokeless tobacco: Never Used  . Alcohol Use: No      No Known Allergies  Current Outpatient Prescriptions  Medication Sig Dispense Refill  . cetirizine (ZYRTEC) 10 MG tablet Take 10 mg by mouth daily.    Marland Kitchen HYDROcodone-acetaminophen (NORCO/VICODIN) 5-325 MG tablet Take 1 tablet by mouth every 6 (six) hours as needed for moderate pain. 30 tablet 0  . promethazine (PHENERGAN) 12.5 MG tablet      No current facility-administered medications for this visit.  OBJECTIVE: PHYSICAL EXAM: GENERAL:  Well developed, well nourished, sitting comfortably in the exam room in no acute distress. MENTAL STATUS:  Alert and oriented to person, place and time.  conjunctivitis or scleral icterus. ENT:  Oropharynx clear without lesion.  Tongue normal. Mucous membranes moist.  RESPIRATORY:  Clear to auscultation without rales,  wheezes or rhonchi. CARDIOVASCULAR:  Regular rate and rhythm without murmur, rub or gallop. BREAST:  Right breast without masses, skin changes or nipple discharge.  Left breast without masses, skin changes or nipple discharge. ABDOMEN:  Soft, non-tender, with active bowel sounds, and no hepatosplenomegaly.  No masses. BACK:  No CVA tenderness.  No tenderness on percussion of the back or rib cage. SKIN:  No rashes, ulcers or lesions. EXTREMITIES: No edema, no skin discoloration or tenderness.  No palpable cords. LYMPH NODES: No palpable cervical, supraclavicular, axillary or inguinal adenopathy  NEUROLOGICAL: Unremarkable. PSYCH:  Appropriate.e.  Filed Vitals:   03/30/15 1340  BP: 117/71  Pulse: 68  Temp: 97.8 F (36.6 C)  Resp: 18     Body mass index is 24.67 kg/(m^2).    ECOG FS:0 - Asymptomatic  LAB RESULTS:  Lab data from [facility] has been reviewed.  No abnormality detected.  Lab data from CBC LDH and sedimentation rate has been ordered today  STUDIES: ET scan has been reviewed independently as well as PET scan is been reviewed independently.  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 Duration of visit is 25 minutes and 50% of time was spent discussing VARIOUS  options  Patient does not have any significant comorbid condition   Patient expressed understanding and was in agreement with this plan. She also understands that She  can call clinic at any time with any questions, concerns, or complaints.    No matching staging information was found for the patient.  [name], MD   03/30/2015 2:03 PM

## 2015-04-05 ENCOUNTER — Encounter: Payer: Self-pay | Admitting: General Surgery

## 2015-04-05 ENCOUNTER — Ambulatory Visit (INDEPENDENT_AMBULATORY_CARE_PROVIDER_SITE_OTHER): Payer: BLUE CROSS/BLUE SHIELD | Admitting: General Surgery

## 2015-04-05 VITALS — BP 116/68 | HR 78 | Resp 12 | Ht 60.0 in | Wt 134.0 lb

## 2015-04-05 DIAGNOSIS — Z114 Encounter for screening for human immunodeficiency virus [HIV]: Secondary | ICD-10-CM | POA: Diagnosis not present

## 2015-04-05 DIAGNOSIS — R1011 Right upper quadrant pain: Secondary | ICD-10-CM

## 2015-04-05 NOTE — Patient Instructions (Addendum)
This patient is to have lab work drawn today.  Patient to have an ultrasound of the gallbladder at [place] for 04-07-15 at 8 am (arrive 7:45 am). Prep: nothing to eat/drink after midnight.

## 2015-04-05 NOTE — Progress Notes (Signed)
Patient ID: Krystal Hardy, female   DOB: 06-30-1980, 34 y.o.   MRN: [id number]  Chief Complaint  Patient presents with  . Abdominal Pain    HPI Krystal Hardy is a 34 y.o. female here today for a evaluation of a abdominal mass. Patient had an abnormal CT scan of abdomen at [facility] and on 03/29/15 she had a PET scan done. There is solid appearing mass in the lesser curvature of the stomach measuring 2.8 x 2.1 cm.She states she has been having pain in her right upper quadrant for three weeks ago and also pain sometimes in her right back. She has not had an [place] of gallbladder. After her initial CT she was evaluated by oncology and endoscopic US guided biopsy of the mass near lesser curve of stomach was performed-path is inconclusive. I have reviewed the history of present illness with the patient.  HPI  Past Medical History  Diagnosis Date  . Allergy   . Allergy   . Medical history non-contributory     Past Surgical History  Procedure Laterality Date  . No past surgeries    . Upper esophageal endoscopic ultrasound (eus) N/A 03/24/2015    Procedure: UPPER ESOPHAGEAL ENDOSCOPIC ULTRASOUND (EUS);  Surgeon: Jola Schmidt, MD;  Location: [facility] ENDOSCOPY;  Service: Endoscopy;  Laterality: N/A;    Family History  Problem Relation Age of Onset  . Breast cancer Maternal Aunt     Social History Social History  Substance Use Topics  . Smoking status: Never Smoker   . Smokeless tobacco: Never Used  . Alcohol Use: No    No Known Allergies  Current Outpatient Prescriptions  Medication Sig Dispense Refill  . cetirizine (ZYRTEC) 10 MG tablet Take 10 mg by mouth daily.    Marland Kitchen HYDROcodone-acetaminophen (NORCO/VICODIN) 5-325 MG tablet Take 1 tablet by mouth every 6 (six) hours as needed for moderate pain. 30 tablet 0  . promethazine (PHENERGAN) 12.5 MG tablet Reported on 04/05/2015     No current facility-administered medications for this visit.    Review of Systems Review of Systems  Constitutional:  Negative.   Respiratory: Negative.   Cardiovascular: Negative.   Gastrointestinal: Positive for abdominal pain. Negative for nausea, vomiting, diarrhea and constipation.    Blood pressure 116/68, pulse 78, resp. rate 12, height 5' (1.524 m), weight 134 lb (60.782 kg), last menstrual period 03/31/2015.  Physical Exam Physical Exam  Constitutional: She is oriented to person, place, and time. She appears well-developed and well-nourished.  Eyes: Conjunctivae are normal. No scleral icterus.  Neck: Neck supple.  Cardiovascular: Normal rate, regular rhythm and normal heart sounds.   Pulmonary/Chest: Effort normal and breath sounds normal.  Abdominal: Soft. Bowel sounds are normal. There is no hepatomegaly. There is no tenderness. No hernia.  Lymphadenopathy:    She has no cervical adenopathy.    She has no axillary adenopathy.  Neurological: She is alert and oriented to person, place, and time.  Skin: Skin is warm and dry.    Data Reviewed Notes and ct/PET scan reviewed In addition to the mass near the lesser curve some retroperitoneal lymphadenopathy noted.. All of these are minimally PET avid. Labs were normal. Biopsy of the lesser curve mass- lymph tissue and some spindle cells. Assessment   RUQ abd pain- not sure if this biliary source or from her intraabd findings as noted above. Based on Cancer case conference discussion, additional biopsy requested.  Would first r/o biliary source for pain. IF [place] ids normal will have pt get CT  guided biopsy of a retroperitonea node. If US shows gallstones, will proceed with cholecystectomy and at same time try to get additional biopsy of the mass near lesser curve of stomach Pathologist also requested HIV testing All of this discussed fully with pt and she is agreeable.,    Plan      This patient will be sent to [facility] to have the following drawn: HIV testing.  Patient to have an ultrasound of the gallbladder at [place] for 04-07-15 at 8 am (arrive 7:45 am). Prep: nothing to eat/drink after midnight.     This information has been scribed by Gaspar Cola CMA.  PCP:  Charolotte Eke G 04/06/2015, 11:54 AM

## 2015-04-06 ENCOUNTER — Encounter: Payer: Self-pay | Admitting: General Surgery

## 2015-04-06 LAB — HIV ANTIBODY (ROUTINE TESTING W REFLEX): HIV Screen 4th Generation wRfx: NONREACTIVE

## 2015-04-07 ENCOUNTER — Encounter: Payer: Self-pay | Admitting: General Surgery

## 2015-04-07 ENCOUNTER — Ambulatory Visit (INDEPENDENT_AMBULATORY_CARE_PROVIDER_SITE_OTHER): Payer: BLUE CROSS/BLUE SHIELD | Admitting: General Surgery

## 2015-04-07 ENCOUNTER — Encounter: Payer: Self-pay | Admitting: Family Medicine

## 2015-04-07 ENCOUNTER — Ambulatory Visit (INDEPENDENT_AMBULATORY_CARE_PROVIDER_SITE_OTHER): Payer: No Typology Code available for payment source | Admitting: Family Medicine

## 2015-04-07 ENCOUNTER — Ambulatory Visit
Admission: RE | Admit: 2015-04-07 | Discharge: 2015-04-07 | Disposition: A | Discharge status: Home / Self Care | Payer: No Typology Code available for payment source | Source: Ambulatory Visit | Attending: General Surgery | Admitting: General Surgery

## 2015-04-07 VITALS — BP 110/68 | HR 78 | Resp 12 | Ht 60.0 in | Wt 134.0 lb

## 2015-04-07 VITALS — BP 100/60 | HR 72 | Ht 60.0 in | Wt 134.0 lb

## 2015-04-07 DIAGNOSIS — R1011 Right upper quadrant pain: Secondary | ICD-10-CM | POA: Diagnosis not present

## 2015-04-07 DIAGNOSIS — K8020 Calculus of gallbladder without cholecystitis without obstruction: Secondary | ICD-10-CM | POA: Diagnosis not present

## 2015-04-07 DIAGNOSIS — R1900 Intra-abdominal and pelvic swelling, mass and lump, unspecified site: Secondary | ICD-10-CM

## 2015-04-07 DIAGNOSIS — J0100 Acute maxillary sinusitis, unspecified: Secondary | ICD-10-CM | POA: Diagnosis not present

## 2015-04-07 MED ORDER — BENZONATATE 100 MG PO CAPS: 100.0000 mg | ORAL_CAPSULE | Freq: Two times a day (BID) | ORAL | Status: DC | PRN

## 2015-04-07 MED ORDER — AZITHROMYCIN 250 MG PO TABS: ORAL_TABLET | ORAL | Status: DC

## 2015-04-07 NOTE — Patient Instructions (Addendum)
Laparoscopic Cholecystectomy Laparoscopic cholecystectomy is surgery to remove the gallbladder. The gallbladder is located in the upper right part of the abdomen, behind the liver. It is a storage sac for bile, which is produced in the liver. Bile aids in the digestion and absorption of fats. Cholecystectomy is often done for inflammation of the gallbladder (cholecystitis). This condition is usually caused by a buildup of gallstones (cholelithiasis) in the gallbladder. Gallstones can block the flow of bile, and that can result in inflammation and pain. In severe cases, emergency surgery may be required. If emergency surgery is not required, you will have time to prepare for the procedure. Laparoscopic surgery is an alternative to open surgery. Laparoscopic surgery has a shorter recovery time. Your common bile duct may also need to be examined during the procedure. If stones are found in the common bile duct, they may be removed. LET YOUR HEALTH CARE PROVIDER KNOW ABOUT:  Any allergies you have.  All medicines you are taking, including vitamins, herbs, eye drops, creams, and over-the-counter medicines.  Previous problems you or members of your family have had with the use of anesthetics.  Any blood disorders you have.  Previous surgeries you have had.  Any medical conditions you have. RISKS AND COMPLICATIONS Generally, this is a safe procedure. However, problems may occur, including:  Infection.  Bleeding.  Allergic reactions to medicines.  Damage to other structures or organs.  A stone remaining in the common bile duct.  A bile leak from the cyst duct that is clipped when your gallbladder is removed.  The need to convert to open surgery, which requires a larger incision in the abdomen. This may be necessary if your surgeon thinks that it is not safe to continue with a laparoscopic procedure. BEFORE THE PROCEDURE  Ask your health care provider about:  Changing or stopping your  regular medicines. This is especially important if you are taking diabetes medicines or blood thinners.  Taking medicines such as aspirin and ibuprofen. These medicines can thin your blood. Do not take these medicines before your procedure if your health care provider instructs you not to.  Follow instructions from your health care provider about eating or drinking restrictions.  Let your health care provider know if you develop a cold or an infection before surgery.  Plan to have someone take you home after the procedure.  Ask your health care provider how your surgical site will be marked or identified.  You may be given antibiotic medicine to help prevent infection. PROCEDURE  To reduce your risk of infection:  Your health care team will wash or sanitize their hands.  Your skin will be washed with soap.  An IV tube may be inserted into one of your veins.  You will be given a medicine to make you fall asleep (general anesthetic).  A breathing tube will be placed in your mouth.  The surgeon will make several small cuts (incisions) in your abdomen.  A thin, lighted tube (laparoscope) that has a tiny camera on the end will be inserted through one of the small incisions. The camera on the laparoscope will send a picture to a TV screen (monitor) in the operating room. This will give the surgeon a good view inside your abdomen.  A gas will be pumped into your abdomen. This will expand your abdomen to give the surgeon more room to perform the surgery.  Other tools that are needed for the procedure will be inserted through the other incisions. The gallbladder will   be removed through one of the incisions.  After your gallbladder has been removed, the incisions will be closed with stitches (sutures), staples, or skin glue.  Your incisions may be covered with a bandage (dressing). The procedure may vary among health care providers and hospitals. AFTER THE PROCEDURE  Your blood  pressure, heart rate, breathing rate, and blood oxygen level will be monitored often until the medicines you were given have worn off.  You will be given medicines as needed to control your pain.   This information is not intended to replace advice given to you by your health care provider. Make sure you discuss any questions you have with your health care provider.   Document Released: 03/26/2005 Document Revised: 12/15/2014 Document Reviewed: 11/05/2012 Elsevier Interactive Patient Education 2016 [employer].  Patient's surgery has been scheduled for 04-18-15 at [facility].

## 2015-04-07 NOTE — Progress Notes (Addendum)
Patient ID: Krystal Hardy, female   DOB: [birth year], 34 y.o.   MRN: [id number]  Chief Complaint  Patient presents with  . Follow-up    ultrasound    HPI Krystal Hardy is a 34 y.o. female. here today to discuss her abdomen ultrasound that was done 04/07/15.     I have reviewed the history of present illness with the patient.                                                               HPI  Past Medical History  Diagnosis Date  . Allergy   . Allergy   . Medical history non-contributory     Past Surgical History  Procedure Laterality Date  . No past surgeries    . Upper esophageal endoscopic ultrasound (eus) N/A 03/24/2015    Procedure: UPPER ESOPHAGEAL ENDOSCOPIC ULTRASOUND (EUS);  Surgeon: Jola Schmidt, MD;  Location: [facility] ENDOSCOPY;  Service: Endoscopy;  Laterality: N/A;    Family History  Problem Relation Age of Onset  . Breast cancer Maternal Aunt     Social History Social History  Substance Use Topics  . Smoking status: Never Smoker   . Smokeless tobacco: Never Used  . Alcohol Use: No    No Known Allergies  Current Outpatient Prescriptions  Medication Sig Dispense Refill  . azithromycin (ZITHROMAX) 250 MG tablet 2 today then one a day 6 tablet 0  . benzonatate (TESSALON) 100 MG capsule Take 1 capsule (100 mg total) by mouth 2 (two) times daily as needed for cough. 20 capsule 0  . cetirizine (ZYRTEC) 10 MG tablet Take 10 mg by mouth daily.    Marland Kitchen HYDROcodone-acetaminophen (NORCO/VICODIN) 5-325 MG tablet Take 1 tablet by mouth every 6 (six) hours as needed for moderate pain. 30 tablet 0  . promethazine (PHENERGAN) 12.5 MG tablet Reported on 04/07/2015     No current facility-administered medications for this visit.    Review of Systems Review of Systems  Constitutional: Negative.   Respiratory: Negative.   Cardiovascular: Negative.     Blood pressure 110/68, pulse 78, resp. rate 12, height 5' (1.524 m), weight 134 lb (60.782 kg), last menstrual period  03/31/2015.  Physical Exam Physical Exam  Constitutional: She is oriented to person, place, and time. She appears well-developed and well-nourished.  Neurological: She is alert and oriented to person, place, and time.  Skin: Skin is warm and dry.    Data Reviewed Ultrasound reviewed showing gallstones. HIV test was negative  Assessment    Gallstones. Likely this has been the source of her pain. Recommend lap/cholecystectomy, biopsy of perigastric mass at same time.    Plan    Laparoscopic Cholecystectomy with Intraoperative Cholangiogram. The procedure, including it's potential risks and complications (including but not limited to infection, bleeding, injury to intra-abdominal organs or bile ducts, bile leak, poor cosmetic result, sepsis and death) were discussed with the patient in detail. Non-operative options, including their inherent risks (acute calculous cholecystitis with possible choledocholithiasis or gallstone pancreatitis, with the risk of ascending cholangitis, sepsis, and death) were discussed as well. The patient expressed and understanding of what we discussed and wishes to proceed with laparoscopic cholecystectomy. The patient further understands that if it is technically not possible, or it is unsafe to proceed laparoscopically, that I will  convert to an open cholecystectomy.    At same time plan for biopsy/excision of the perigastric mass. All of this discussed fully with pt today and also on her last visit 2 days ago Patient's surgery has been scheduled for 04-18-15 at [facility].  PCP:  Ronnald Ramp,  This information has been scribed by Gaspar Cola CMA.  SANKAR,SEEPLAPUTHUR G 04/07/2015, 4:00 PM

## 2015-04-07 NOTE — Progress Notes (Signed)
Name: Krystal Hardy   MRN: [id number]    DOB: 07/15/1980   Date:04/07/2015       Progress Note  Subjective  Chief Complaint  Chief Complaint  Patient presents with  . Sinusitis    coughing, scratchy throat    Sinusitis This is a recurrent problem. The current episode started yesterday. The problem has been gradually worsening since onset. There has been no fever. Pertinent negatives include no chills, congestion, coughing, diaphoresis, ear pain, headaches, hoarse voice, neck pain, shortness of breath, sinus pressure, sneezing, sore throat or swollen glands. Past treatments include nothing. The treatment provided mild relief.    No problem-specific assessment & plan notes found for this encounter.   Past Medical History  Diagnosis Date  . Allergy   . Allergy   . Medical history non-contributory     Past Surgical History  Procedure Laterality Date  . No past surgeries    . Upper esophageal endoscopic ultrasound (eus) N/A 03/24/2015    Procedure: UPPER ESOPHAGEAL ENDOSCOPIC ULTRASOUND (EUS);  Surgeon: Jola Schmidt, MD;  Location: [facility] ENDOSCOPY;  Service: Endoscopy;  Laterality: N/A;    Family History  Problem Relation Age of Onset  . Breast cancer Maternal Aunt     Social History   Social History  . Marital Status: Married    Spouse Name: N/A  . Number of Children: N/A  . Years of Education: N/A   Occupational History  . Not on file.   Social History Main Topics  . Smoking status: Never Smoker   . Smokeless tobacco: Never Used  . Alcohol Use: No  . Drug Use: No  . Sexual Activity: Yes   Other Topics Concern  . Not on file   Social History Narrative    No Known Allergies   Review of Systems  Constitutional: Negative for fever, chills, weight loss, malaise/fatigue and diaphoresis.  HENT: Negative for congestion, ear discharge, ear pain, hoarse voice, sinus pressure, sneezing and sore throat.   Eyes: Negative for blurred vision.  Respiratory: Negative for  cough, sputum production, shortness of breath and wheezing.   Cardiovascular: Negative for chest pain, palpitations and leg swelling.  Gastrointestinal: Positive for abdominal pain. Negative for heartburn, nausea, diarrhea, constipation, blood in stool and melena.  Genitourinary: Negative for dysuria, urgency, frequency and hematuria.  Musculoskeletal: Negative for myalgias, back pain, joint pain and neck pain.  Skin: Negative for rash.  Neurological: Negative for dizziness, tingling, sensory change, focal weakness and headaches.  Endo/Heme/Allergies: Negative for environmental allergies and polydipsia. Does not bruise/bleed easily.  Psychiatric/Behavioral: Negative for depression and suicidal ideas. The patient is not nervous/anxious and does not have insomnia.      Objective  Filed Vitals:   04/07/15 1347  BP: 100/60  Pulse: 72  Height: 5' (1.524 m)  Weight: 134 lb (60.782 kg)    Physical Exam  Constitutional: She is well-developed, well-nourished, and in no distress. No distress.  HENT:  Head: Normocephalic and atraumatic.  Right Ear: External ear normal.  Left Ear: External ear normal.  Nose: Nose normal.  Mouth/Throat: Oropharynx is clear and moist.  Eyes: Conjunctivae and EOM are normal. Pupils are equal, round, and reactive to light. Right eye exhibits no discharge. Left eye exhibits no discharge.  Neck: Normal range of motion. Neck supple. No JVD present. No thyromegaly present.  Cardiovascular: Normal rate, regular rhythm, normal heart sounds and intact distal pulses.  Exam reveals no gallop and no friction rub.   No murmur heard. Pulmonary/Chest: Effort normal  and breath sounds normal.  Abdominal: Soft. Bowel sounds are normal. She exhibits no mass. There is no tenderness. There is no guarding.  Musculoskeletal: Normal range of motion. She exhibits no edema.  Lymphadenopathy:    She has no cervical adenopathy.  Neurological: She is alert. She has normal reflexes.   Skin: Skin is warm and dry. She is not diaphoretic.  Psychiatric: Mood and affect normal.  Nursing note and vitals reviewed.     Assessment & Plan  Problem List Items Addressed This Visit    None    Visit Diagnoses    Acute maxillary sinusitis, recurrence not specified    -  Primary    Relevant Medications    azithromycin (ZITHROMAX) 250 MG tablet    benzonatate (TESSALON) 100 MG capsule         Dr. [facility]  04/07/2015

## 2015-04-12 ENCOUNTER — Ambulatory Visit: Payer: No Typology Code available for payment source

## 2015-04-13 ENCOUNTER — Inpatient Hospital Stay: Admission: RE | Admit: 2015-04-13 | Payer: No Typology Code available for payment source | Source: Ambulatory Visit

## 2015-04-13 ENCOUNTER — Telehealth: Payer: Self-pay | Admitting: General Surgery

## 2015-04-13 NOTE — Telephone Encounter (Signed)
Pt was called regarding her surgery scheduled for next week. Pt reports she is going to [place]  In a couple of days and will be back here in February. She will cal [place] when she gets back to reschedule her surgery. She is aware of the potential complications of gallstones  and to seek attention in case of worsening pain, fever,chills, etc.

## 2015-04-18 ENCOUNTER — Ambulatory Visit
Admission: RE | Admit: 2015-04-18 | Payer: BLUE CROSS/BLUE SHIELD | Source: Ambulatory Visit | Admitting: General Surgery

## 2015-04-18 ENCOUNTER — Encounter: Admission: RE | Payer: Self-pay | Source: Ambulatory Visit

## 2015-04-18 SURGERY — LAPAROSCOPIC CHOLECYSTECTOMY WITH INTRAOPERATIVE CHOLANGIOGRAM
Anesthesia: Choice

## 2015-06-24 ENCOUNTER — Encounter: Payer: Self-pay | Admitting: Family Medicine

## 2015-06-24 ENCOUNTER — Ambulatory Visit (INDEPENDENT_AMBULATORY_CARE_PROVIDER_SITE_OTHER): Payer: BLUE CROSS/BLUE SHIELD | Admitting: Family Medicine

## 2015-06-24 VITALS — BP 116/64 | HR 80 | Ht 60.0 in | Wt 139.2 lb

## 2015-06-24 DIAGNOSIS — R599 Enlarged lymph nodes, unspecified: Secondary | ICD-10-CM

## 2015-06-24 DIAGNOSIS — M1990 Unspecified osteoarthritis, unspecified site: Secondary | ICD-10-CM

## 2015-06-24 DIAGNOSIS — C49A4 Gastrointestinal stromal tumor of large intestine: Secondary | ICD-10-CM

## 2015-06-24 DIAGNOSIS — R590 Localized enlarged lymph nodes: Secondary | ICD-10-CM

## 2015-06-24 MED ORDER — ETODOLAC 400 MG PO TABS: 400.0000 mg | ORAL_TABLET | Freq: Two times a day (BID) | ORAL | Status: DC

## 2015-06-24 NOTE — Progress Notes (Signed)
Name: Krystal Hardy   MRN: [id number]    DOB: 1980-04-24   Date:06/24/2015       Progress Note  Subjective  Chief Complaint  Chief Complaint  Patient presents with  . Hand Pain    Started with the cold weather. Pain radiates through from hand through fingers.   . Joint Pain    multiple sites    Hand Pain  Pertinent negatives include no chest pain or tingling.  Arthritis Presents for initial visit. The disease course has been fluctuating. She complains of pain, stiffness and joint swelling. She reports no joint warmth. Affected locations include the neck, right elbow, left elbow, right wrist, left wrist, right MCP, left MCP, right PIP, left PIP, right DIP, left DIP, right knee and left knee. Associated symptoms include dry eyes, dry mouth and fatigue. Pertinent negatives include no diarrhea, dysuria, fever, pain at night, pain while resting, rash or weight loss. Past treatments include acetaminophen and NSAIDs. The treatment provided no relief. Compliance with prior treatments has been good.    No problem-specific assessment & plan notes found for this encounter.   Past Medical History  Diagnosis Date  . Allergy   . Medical history non-contributory     Past Surgical History  Procedure Laterality Date  . No past surgeries    . Upper esophageal endoscopic ultrasound (eus) N/A 03/24/2015    Procedure: UPPER ESOPHAGEAL ENDOSCOPIC ULTRASOUND (EUS);  Surgeon: Jola Schmidt, MD;  Location: [facility] ENDOSCOPY;  Service: Endoscopy;  Laterality: N/A;    Family History  Problem Relation Age of Onset  . Breast cancer Maternal Aunt     Social History   Social History  . Marital Status: Married    Spouse Name: N/A  . Number of Children: N/A  . Years of Education: N/A   Occupational History  . Not on file.   Social History Main Topics  . Smoking status: Never Smoker   . Smokeless tobacco: Never Used  . Alcohol Use: No  . Drug Use: No  . Sexual Activity: Yes   Other Topics Concern  .  Not on file   Social History Narrative    No Known Allergies   Review of Systems  Constitutional: Positive for fatigue. Negative for fever, chills, weight loss and malaise/fatigue.  HENT: Negative for ear discharge, ear pain and sore throat.   Eyes: Negative for blurred vision.  Respiratory: Negative for cough, sputum production, shortness of breath and wheezing.   Cardiovascular: Negative for chest pain, palpitations and leg swelling.  Gastrointestinal: Negative for heartburn, nausea, abdominal pain, diarrhea, constipation, blood in stool and melena.  Genitourinary: Negative for dysuria, urgency, frequency and hematuria.  Musculoskeletal: Positive for joint swelling, arthritis and stiffness. Negative for myalgias, back pain, joint pain and neck pain.  Skin: Negative for rash.  Neurological: Negative for dizziness, tingling, sensory change, focal weakness and headaches.  Endo/Heme/Allergies: Negative for environmental allergies and polydipsia. Does not bruise/bleed easily.  Psychiatric/Behavioral: Negative for depression and suicidal ideas. The patient is not nervous/anxious and does not have insomnia.      Objective  Filed Vitals:   06/24/15 0808  BP: 116/64  Pulse: 80  Height: 5' (1.524 m)  Weight: 139 lb 3.2 oz (63.141 kg)    Physical Exam  Constitutional: She is well-developed, well-nourished, and in no distress. No distress.  HENT:  Head: Normocephalic and atraumatic.  Right Ear: External ear normal.  Left Ear: External ear normal.  Nose: Nose normal.  Mouth/Throat: Oropharynx is clear and  moist.  Eyes: Conjunctivae and EOM are normal. Pupils are equal, round, and reactive to light. Right eye exhibits no discharge. Left eye exhibits no discharge.  Neck: Normal range of motion. Neck supple. No JVD present. No thyromegaly present.  Cardiovascular: Normal rate, regular rhythm, normal heart sounds and intact distal pulses.  Exam reveals no gallop and no friction rub.    No murmur heard. Pulmonary/Chest: Effort normal and breath sounds normal.  Abdominal: Soft. Bowel sounds are normal. She exhibits no mass. There is no tenderness. There is no guarding.  Musculoskeletal: Normal range of motion. She exhibits no edema.       Right wrist: She exhibits tenderness.       Left wrist: She exhibits tenderness.       Right hand: She exhibits tenderness.       Left hand: She exhibits tenderness.  Lymphadenopathy:    She has no cervical adenopathy.       Right: Epitrochlear adenopathy present.  Neurological: She is alert. She has normal reflexes.  Skin: Skin is warm and dry. She is not diaphoretic.  Psychiatric: Mood and affect normal.      Assessment & Plan  Problem List Items Addressed This Visit    None    Visit Diagnoses    Arthritis    -  Primary    Relevant Medications    etodolac (LODINE) 400 MG tablet    Other Relevant Orders    Lactate Dehydrogenase (LDH)    Sed Rate (ESR)    CBC w/Diff/Platelet    GIST (gastrointestinal stroma tumor), malignant, colon (HCC)        contact cancer center    Relevant Medications    etodolac (LODINE) 400 MG tablet    Other Relevant Orders    Lactate Dehydrogenase (LDH)    Sed Rate (ESR)    CBC w/Diff/Platelet    Epitrochlear lymphadenopathy             Dr. Iya Hamed [facility]  06/24/2015

## 2015-06-25 LAB — CBC WITH DIFFERENTIAL/PLATELET
BASOS: 1 %
Basophils Absolute: 0 10*3/uL (ref 0.0–0.2)
EOS (ABSOLUTE): 0 10*3/uL (ref 0.0–0.4)
EOS: 1 %
HEMATOCRIT: 39.6 % (ref 34.0–46.6)
Hemoglobin: 13.1 g/dL (ref 11.1–15.9)
IMMATURE GRANULOCYTES: 0 %
Immature Grans (Abs): 0 10*3/uL (ref 0.0–0.1)
Lymphocytes Absolute: 1.1 10*3/uL (ref 0.7–3.1)
Lymphs: 30 %
MCH: 30 pg (ref 26.6–33.0)
MCHC: 33.1 g/dL (ref 31.5–35.7)
MCV: 91 fL (ref 79–97)
MONOS ABS: 0.4 10*3/uL (ref 0.1–0.9)
Monocytes: 12 %
NEUTROS ABS: 2.2 10*3/uL (ref 1.4–7.0)
NEUTROS PCT: 56 %
Platelets: 281 10*3/uL (ref 150–379)
RBC: 4.36 x10E6/uL (ref 3.77–5.28)
RDW: 13.1 % (ref 12.3–15.4)
WBC: 3.8 10*3/uL (ref 3.4–10.8)

## 2015-06-25 LAB — LACTATE DEHYDROGENASE: LDH: 144 IU/L (ref 119–226)

## 2015-06-25 LAB — SEDIMENTATION RATE: Sed Rate: 2 mm/hr (ref 0–32)

## 2015-07-01 ENCOUNTER — Inpatient Hospital Stay: Payer: BLUE CROSS/BLUE SHIELD | Attending: Oncology | Admitting: Oncology

## 2015-07-01 ENCOUNTER — Encounter: Payer: Self-pay | Admitting: Oncology

## 2015-07-01 VITALS — BP 110/74 | HR 73 | Temp 96.9°F | Resp 18 | Wt 137.0 lb

## 2015-07-01 DIAGNOSIS — R1011 Right upper quadrant pain: Secondary | ICD-10-CM | POA: Diagnosis not present

## 2015-07-01 DIAGNOSIS — Z79899 Other long term (current) drug therapy: Secondary | ICD-10-CM | POA: Diagnosis not present

## 2015-07-01 DIAGNOSIS — R1084 Generalized abdominal pain: Secondary | ICD-10-CM

## 2015-07-01 DIAGNOSIS — R590 Localized enlarged lymph nodes: Secondary | ICD-10-CM | POA: Insufficient documentation

## 2015-07-01 DIAGNOSIS — R1901 Right upper quadrant abdominal swelling, mass and lump: Secondary | ICD-10-CM | POA: Diagnosis not present

## 2015-07-01 DIAGNOSIS — D490 Neoplasm of unspecified behavior of digestive system: Secondary | ICD-10-CM

## 2015-07-01 NOTE — Progress Notes (Signed)
Patient states she is here today because she is having increased pain in elbows and knees. Tender to touch.  Also has an area in upper left abdomen that feels solid. States she has pain there that is never resolved.  States she is nauseated sometimes. Having constipation as well.

## 2015-07-01 NOTE — Progress Notes (Signed)
[place] @ [facility] [phone]  [phone]   INITIAL CONSULT  Krystal Hardy OB: Oct 10, 1980  MR#: [id number]  [id number]  Patient Care Team: Juline Patch, MD as PCP - General (Family Medicine) [name], MD (Oncology) Seeplaputhur [name], MD (General Surgery)  CHIEF COMPLAINT: Oncology history Chief Complaint  Patient presents with  . Abdominal lymphadenopathy   patient had an abnormal CT scan of abdomen.  There is solid appearing mass in the lesser curvature of the stomach measuring 2.8 x 2.1 cm.  There are retroperitoneal and pelvic adenopathy.  Retroperitoneal lymph nodes include (CT scan was done at [facility] in December of 2016)  Aortic lymph nodes.  The right external iliac node.  Left external iliac node.  VISIT DIAGNOSIS:   No diagnosis found.    No history exists.    No flowsheet data found.  INTERVAL HISTORY: 35 year old lady came today further follow-up.  Patient is having abdominal pain and discomfort patient went to [facility] emergency room where a CT scan was done which was abnormal patient was referred to me for further evaluation and treatment consideration. He should not is getting regular menstrual cycle.  Has IUD. As history of one pregnancy. Patient had endoscopy, ultrasound guided biopsy in which revealed spindle cells but no conclusive evidence of diagnosis.  There was no evidence of monoclonal B-cell population. Pet  scan was done which has been reviewed independently..  Patient continues to have epigastric discomfort and pain. Patient had been to [place] for treatment of her presumed cancer state that for one month had our verbal medicine.  Back here for further evaluation. His joint pains persist and pain Appetite has been stable  [name], RN Registered Nurse Sign at close encounter  Service date: 07/01/2015 9:29 AM    Expand All Collapse All    Patient states she is here today because she is having increased pain in  elbows and knees. Tender to touch. Also has an area in upper left abdomen that feels solid. States she has pain there that is never resolved. States she is nauseated sometimes. Having constipation as well     REVIEW OF SYSTEMS:   GENERAL:  Feels good.  Active.  No fevers, sweats or weight loss. PERFORMANCE STATUS (ECOG): 0 HEENT:  No visual changes, runny nose, sore throat, mouth sores or tenderness. Lungs: No shortness of breath or cough.  No hemoptysis. Cardiac:  No chest pain, palpitations, orthopnea, or PND. GI: increasing abdominal pain and discomfort. GU:  No urgency, frequency, dysuria, or hematuria. Musculoskeletal:  No back pain.  No joint pain.  No muscle tenderness. Extremities:  No pain or swelling. Skin:  No rashes or skin changes. Neuro:  No headache, numbness or weakness, balance or coordination issues. Endocrine:  No diabetes, thyroid issues, hot flashes or night sweats. Psych:  No mood changes, depression or anxiety. Pain:  No focal pain. Review of systems:  All other systems reviewed and found to be negative.  As per HPI. Otherwise, a complete review of systems is negatve.  PAST MEDICAL HISTORY: Past Medical History  Diagnosis Date  . Allergy   . Medical history non-contributory     PAST SURGICAL HISTORY: No significant past medical history.  patient has IUD.  one pregnancy FAMILY HISTORY There is no significant family history of, ovarian cancer, colon cancer E8 family history of breast cancer. GYNECOLOGIC HISTORY:  Regular menstrual cycle   ADVANCED DIRECTIVES:   Patient does not have any living will  or healthcare power of attorney.  Information was given .  Available resources had been discussed.  We will follow-up on subsequent appointments regarding this issue HEALTH MAINTENANCE: Social History  Substance Use Topics  . Smoking status: Never Smoker   . Smokeless tobacco: Never Used  . Alcohol Use: No      No Known Allergies  Current Outpatient  Prescriptions  Medication Sig Dispense Refill  . cetirizine (ZYRTEC) 10 MG tablet Take 10 mg by mouth daily.    Marland Kitchen etodolac (LODINE) 400 MG tablet Take 1 tablet (400 mg total) by mouth 2 (two) times daily. 60 tablet 3  . fluticasone (FLONASE) 50 MCG/ACT nasal spray Place 1 spray into both nostrils daily.  0   No current facility-administered medications for this visit.    OBJECTIVE: PHYSICAL EXAM: GENERAL:  Well developed, well nourished, sitting comfortably in the exam room in no acute distress. MENTAL STATUS:  Alert and oriented to person, place and time.  conjunctivitis or scleral icterus. ENT:  Oropharynx clear without lesion.  Tongue normal. Mucous membranes moist.  RESPIRATORY:  Clear to auscultation without rales, wheezes or rhonchi. CARDIOVASCULAR:  Regular rate and rhythm without murmur, rub or gallop.  ABDOMEN:  Tenderness in at epigastric area no palpable masses BACK:  No CVA tenderness.  No tenderness on percussion of the back or rib cage. SKIN:  No rashes, ulcers or lesions. EXTREMITIES: No edema, no skin discoloration or tenderness.  No palpable cords. LYMPH NODES: No palpable cervical, supraclavicular, axillary or inguinal adenopathy  NEUROLOGICAL: Unremarkable. PSYCH:  Appropriate.e.  Filed Vitals:   07/01/15 0928  BP: 110/74  Pulse: 73  Temp: 96.9 F (36.1 C)  Resp: 18     Body mass index is 26.76 kg/(m^2).    ECOG FS:0 - Asymptomatic  LAB RESULTS:  Lab data from [facility] has been reviewed.  No abnormality detected.  Lab data from CBC LDH and sedimentation rate has been ordered today  STUDIES: ET scan has been reviewed independently as well as PET scan is been reviewed independently.  Endoscopy, ultrasound guided biopsy was spindle cells ASSESSMENT:  Patient is continuing epigastric discomfort.  Patient was in [place] taking herbal medication for a month and here for reevaluation. Because of continuing pain will get a CT scan of abdomen and pelvis and  ultrasound of gallbladder If patient has persistent previously recognize mass in the gastric area or will refer patient back to [place]  who   Has seen her before for possible surgical evaluation PLAN:     Patient expressed understanding and was in agreement with this plan. She also understands that She can call clinic at any time with any questions, concerns, or complaints.    No matching staging information was found for the patient.  [name], MD   07/01/2015 9:42 AM

## 2015-07-04 ENCOUNTER — Ambulatory Visit: Payer: No Typology Code available for payment source | Admitting: Oncology

## 2015-07-05 ENCOUNTER — Ambulatory Visit
Admission: RE | Admit: 2015-07-05 | Discharge: 2015-07-05 | Disposition: A | Discharge status: Home / Self Care | Payer: BLUE CROSS/BLUE SHIELD | Source: Ambulatory Visit | Attending: Oncology | Admitting: Oncology

## 2015-07-05 DIAGNOSIS — D371 Neoplasm of uncertain behavior of stomach: Secondary | ICD-10-CM | POA: Diagnosis present

## 2015-07-05 DIAGNOSIS — K760 Fatty (change of) liver, not elsewhere classified: Secondary | ICD-10-CM | POA: Insufficient documentation

## 2015-07-05 DIAGNOSIS — K8020 Calculus of gallbladder without cholecystitis without obstruction: Secondary | ICD-10-CM | POA: Insufficient documentation

## 2015-07-05 DIAGNOSIS — R1011 Right upper quadrant pain: Secondary | ICD-10-CM | POA: Insufficient documentation

## 2015-07-05 DIAGNOSIS — R1084 Generalized abdominal pain: Secondary | ICD-10-CM

## 2015-07-05 DIAGNOSIS — D490 Neoplasm of unspecified behavior of digestive system: Secondary | ICD-10-CM

## 2015-07-05 HISTORY — DX: Unspecified asthma, uncomplicated: J45.909

## 2015-07-05 MED ORDER — IOPAMIDOL (ISOVUE-300) INJECTION 61%
100.0000 mL | Freq: Once | INTRAVENOUS | Status: AC | PRN
  Administered 2015-07-05: 100 mL via INTRAVENOUS

## 2015-07-06 ENCOUNTER — Inpatient Hospital Stay (HOSPITAL_BASED_OUTPATIENT_CLINIC_OR_DEPARTMENT_OTHER): Payer: BLUE CROSS/BLUE SHIELD | Admitting: Family Medicine

## 2015-07-06 VITALS — BP 98/64 | HR 88 | Temp 96.2°F | Resp 18 | Wt 137.8 lb

## 2015-07-06 DIAGNOSIS — R1901 Right upper quadrant abdominal swelling, mass and lump: Secondary | ICD-10-CM

## 2015-07-06 DIAGNOSIS — R1011 Right upper quadrant pain: Secondary | ICD-10-CM | POA: Diagnosis not present

## 2015-07-06 DIAGNOSIS — Z79899 Other long term (current) drug therapy: Secondary | ICD-10-CM

## 2015-07-06 DIAGNOSIS — R590 Localized enlarged lymph nodes: Secondary | ICD-10-CM | POA: Diagnosis not present

## 2015-07-06 NOTE — Progress Notes (Signed)
[place]  [phone]  [phone]) [phone]     Krystal Hardy DOB: Dec 16, 1980  MR#: [id number]  [id number]  Patient Care Team: Juline Patch, MD as PCP - General (Family Medicine) [name], MD (Oncology) Seeplaputhur [name], MD (General Surgery)  CHIEF COMPLAINT:  Chief Complaint  Patient presents with  . Lymphadenopathy  . Abdominal Pain    INTERVAL HISTORY:  Patient is here for further evaluation regarding recent imaging of abdomen with CT scan as well as ultrasound of gallbladder. She was initially seen due to abdominal lymphadenopathy. Noted to have a solid-appearing mass in the lesser curvature of the stomach that measured 2.8 x 2.1 cm. There were also retroperitoneal and pelvic adenopathy, a CT scan was performed at [facility] in December 2016. Patient was scheduled to have a cholecystectomy at the beginning of January due to gallbladder stones and cholecystitis however, she had planned to be out of the country in [place] and wanted to try some alternative medicines/treatments there. Patient also had endoscopy, ultrasound-guided biopsy which revealed spindle cells but no conclusive evidence for diagnosis.  REVIEW OF SYSTEMS:   Review of Systems  Constitutional: Negative for fever, chills, weight loss, malaise/fatigue and diaphoresis.  HENT: Negative.   Eyes: Negative.   Respiratory: Negative for cough, hemoptysis, sputum production, shortness of breath and wheezing.   Cardiovascular: Negative for chest pain, palpitations, orthopnea, claudication, leg swelling and PND.  Gastrointestinal: Positive for abdominal pain. Negative for heartburn, nausea, vomiting, diarrhea, constipation, blood in stool and melena.       Right upper quadrant, intermittent.  Genitourinary: Negative.   Musculoskeletal: Negative.   Skin: Negative.   Neurological: Negative for dizziness, tingling, focal weakness, seizures and weakness.  Endo/Heme/Allergies: Does not  bruise/bleed easily.  Psychiatric/Behavioral: Negative for depression. The patient is not nervous/anxious and does not have insomnia.     As per HPI. Otherwise, a complete review of systems is negatve.   PAST MEDICAL HISTORY: Past Medical History  Diagnosis Date  . Allergy   . Medical history non-contributory   . Asthma     PAST SURGICAL HISTORY: Past Surgical History  Procedure Laterality Date  . No past surgeries    . Upper esophageal endoscopic ultrasound (eus) N/A 03/24/2015    Procedure: UPPER ESOPHAGEAL ENDOSCOPIC ULTRASOUND (EUS);  Surgeon: Jola Schmidt, MD;  Location: [facility] ENDOSCOPY;  Service: Endoscopy;  Laterality: N/A;    FAMILY HISTORY Family History  Problem Relation Age of Onset  . Breast cancer Maternal Aunt     GYNECOLOGIC HISTORY:  Patient's last menstrual period was 07/05/2015.     ADVANCED DIRECTIVES:    HEALTH MAINTENANCE: Social History  Substance Use Topics  . Smoking status: Never Smoker   . Smokeless tobacco: Never Used  . Alcohol Use: No     No Known Allergies  Current Outpatient Prescriptions  Medication Sig Dispense Refill  . cetirizine (ZYRTEC) 10 MG tablet Take 10 mg by mouth daily.    Marland Kitchen etodolac (LODINE) 400 MG tablet Take 1 tablet (400 mg total) by mouth 2 (two) times daily. 60 tablet 3  . fluticasone (FLONASE) 50 MCG/ACT nasal spray Place 1 spray into both nostrils daily.  0   No current facility-administered medications for this visit.    OBJECTIVE: BP 98/64 mmHg  Pulse 88  Temp(Src) 96.2 F (35.7 C) (Tympanic)  Resp 18  Wt 137 lb 12.6 oz (62.5 kg)  LMP 07/05/2015   Body mass index is 26.91 kg/(m^2).    ECOG  FS:0 - Asymptomatic  General: Well-developed, well-nourished, no acute distress. Eyes: Pink conjunctiva, anicteric sclera. Lungs: Clear to auscultation bilaterally. Heart: Regular rate and rhythm. No rubs, murmurs, or gallops. Abdomen: Soft, nontender, nondistended. No organomegaly noted, normoactive bowel  sounds. Musculoskeletal: No edema, cyanosis, or clubbing. Neuro: Alert, answering all questions appropriately. Cranial nerves grossly intact. Skin: No rashes or petechiae noted. Psych: Normal affect. Lymphatics: No cervical, clavicular LAD.   LAB RESULTS:  No visits with results within 3 Day(s) from this visit. Latest known visit with results is:  Office Visit on 06/24/2015  Component Date Value Ref Range Status  . LDH 06/24/2015 144  119 - 226 IU/L Final  . Sed Rate 06/24/2015 2  0 - 32 mm/hr Final  . WBC 06/24/2015 3.8  3.4 - 10.8 x10E3/uL Final  . RBC 06/24/2015 4.36  3.77 - 5.28 x10E6/uL Final  . Hemoglobin 06/24/2015 13.1  11.1 - 15.9 g/dL Final  . Hematocrit 06/24/2015 39.6  34.0 - 46.6 % Final  . MCV 06/24/2015 91  79 - 97 fL Final  . MCH 06/24/2015 30.0  26.6 - 33.0 pg Final  . MCHC 06/24/2015 33.1  31.5 - 35.7 g/dL Final  . RDW 06/24/2015 13.1  12.3 - 15.4 % Final  . Platelets 06/24/2015 281  150 - 379 x10E3/uL Final  . Neutrophils 06/24/2015 56   Final  . Lymphs 06/24/2015 30   Final  . Monocytes 06/24/2015 12   Final  . Eos 06/24/2015 1   Final  . Basos 06/24/2015 1   Final  . Neutrophils Absolute 06/24/2015 2.2  1.4 - 7.0 x10E3/uL Final  . Lymphocytes Absolute 06/24/2015 1.1  0.7 - 3.1 x10E3/uL Final  . Monocytes Absolute 06/24/2015 0.4  0.1 - 0.9 x10E3/uL Final  . EOS (ABSOLUTE) 06/24/2015 0.0  0.0 - 0.4 x10E3/uL Final  . Basophils Absolute 06/24/2015 0.0  0.0 - 0.2 x10E3/uL Final  . Immature Granulocytes 06/24/2015 0   Final  . Immature Grans (Abs) 06/24/2015 0.0  0.0 - 0.1 x10E3/uL Final    STUDIES: Ct Abdomen Pelvis W Contrast  07/05/2015  CLINICAL DATA:  Epigastric/upper abdominal pain, follow-up CT scan EXAM: CT ABDOMEN AND PELVIS WITH CONTRAST TECHNIQUE: Multidetector CT imaging of the abdomen and pelvis was performed using the standard protocol following bolus administration of intravenous contrast. CONTRAST:  138mL ISOVUE-300 IOPAMIDOL (ISOVUE-300)  INJECTION 61% COMPARISON:  PET-CT dated 03/29/2015. Outside hospital [facility]) CT abdomen pelvis dated 03/17/2015. FINDINGS: Lower chest:  Lung bases are clear. Hepatobiliary: Liver is within normal limits. No suspicious/enhancing hepatic lesions. Gallbladder is unremarkable. No intrahepatic or extrahepatic ductal dilatation. Pancreas: Within normal limits. Spleen: Spleen is normal in size. Adrenals/Urinary Tract: Adrenal glands are within normal limits. Kidneys are within limits.  No hydronephrosis. Bladder is mildly thick-walled although underdistended. Stomach/Bowel: Stomach is within normal limits. No evidence of bowel obstruction. Normal appendix (series 2/image 50). Vascular/Lymphatic: No evidence of abdominal aortic aneurysm. 1.7 x 2.6 cm soft tissue lesion adjacent to the gastric antrum and posterior to the lateral segment left hepatic lobe (series 2/ image 22), favored to reflect a perigastric lymph node rather than a true submucosal mass, previously 2.2 x 2.8 cm. Retroperitoneal/ pelvic lymphadenopathy, including: --10 mm short axis left para-aortic node (series 2/ image 35), previously 9 mm --8 mm short axis left para-aortic node (series 2/ image 43), previously 9 mm --10 mm short axis right external iliac node (series 2/ image 55), previously 9 mm --9 mm short axis left external iliac node (series 2/image  70), unchanged Reproductive: Uterus is notable for a T-shaped IUD in satisfactory position. Bilateral ovaries are within normal limits. Other: No abdominopelvic ascites. Musculoskeletal: Visualized osseous structures are within normal limits. IMPRESSION: 1.7 x 2.6 cm soft tissue lesion adjacent to the gastric antrum, favored to reflect a perigastric lymph node rather than a true submucosal mass, mildly decreased. Additional borderline retroperitoneal/ pelvic lymphadenopathy, measuring up to 10 mm short axis, grossly unchanged. Although lack of progression is reassuring, a low-grade lymphoproliferative  disorder remains possible. If intervention is not performed, consider follow-up CT with contrast or MRI with/without contrast in 6 months. (While CT is generally more sensitive for lymphadenopathy, MR may be preferred given the patient's age.) Electronically Signed   By: Julian Hy M.D.   On: 07/05/2015 13:08   US Abdomen Limited Ruq  07/05/2015  CLINICAL DATA:  Intermittent right upper quadrant pain for the past 5 months ; known gallstones EXAM: US ABDOMEN LIMITED - RIGHT UPPER QUADRANT COMPARISON:  Limited abdominal ultrasound of April 07, 2015 FINDINGS: Gallbladder: The gallbladder is adequately distended. There are multiple echogenic mobile shadowing stones. The largest measures 5 mm in diameter. There is no gallbladder wall thickening, pericholecystic fluid, or positive sonographic Murphy's sign. Common bile duct: Diameter: 6.5 mm which is at the upper limits of normal. This is stable. Liver: The hepatic echotexture is mildly increased. There is no area of decreased echogenicity adjacent to the gallbladder fossa compatible with focal fatty sparing. There is no intrahepatic ductal dilation. The surface contour of the liver is normal. IMPRESSION: 1. Gallstones without sonographic evidence of acute cholecystitis. 2. Increased hepatic echotexture consistent with fatty infiltration. Electronically Signed   By: David  Martinique M.D.   On: 07/05/2015 09:44    ASSESSMENT:  Abdominal lymphadenopathy.  Right upper quadrant abdominal pain.  PLAN:  1. Abdominal lymphadenopathy. Patient continues to have right upper quadrant and epigastric discomfort. She has recently been in [place] and was undergoing treatment with alternative medicine. She was last seen on March 24 by Dr. Oliva Bustard following her return from [place] and was still having intermittent discomfort. CT scan of abdomen and pelvis continues to reveal a 1.7 x 2.6 cm soft tissue lesion that is adjacent to the gastric antrum, favored to be a perigastric  lymph node that has mildly decreased in size. There are also additional borderline retroperitoneal and pelvic lymphadenopathies that measure up to 10 mm.  As previously discussed with Dr. Oliva Bustard, patient will be referred back to Dr. Jamal Collin for further evaluation and possible surgical intervention.  Advised patient to call our office with Dr. Jamal Collin recommendations to schedule further follow-up. Patient expressed understanding and was in agreement with this plan. She also understands that She can call clinic at any time with any questions, concerns, or complaints.   Dr. Rogue Bussing available for consultation and review of plan of care for this patient.   Evlyn Kanner, NP   07/06/2015 12:01 PM

## 2015-07-11 ENCOUNTER — Ambulatory Visit (INDEPENDENT_AMBULATORY_CARE_PROVIDER_SITE_OTHER): Payer: BLUE CROSS/BLUE SHIELD | Admitting: General Surgery

## 2015-07-11 ENCOUNTER — Other Ambulatory Visit: Payer: BLUE CROSS/BLUE SHIELD

## 2015-07-11 ENCOUNTER — Encounter: Payer: Self-pay | Admitting: General Surgery

## 2015-07-11 VITALS — BP 100/68 | HR 72 | Resp 12 | Ht 62.0 in | Wt 137.0 lb

## 2015-07-11 DIAGNOSIS — K8020 Calculus of gallbladder without cholecystitis without obstruction: Secondary | ICD-10-CM | POA: Diagnosis not present

## 2015-07-11 DIAGNOSIS — R1900 Intra-abdominal and pelvic swelling, mass and lump, unspecified site: Secondary | ICD-10-CM | POA: Diagnosis not present

## 2015-07-11 NOTE — Progress Notes (Signed)
Patient ID: Krystal Hardy, female   DOB: 03-29-1981, 35 y.o.   MRN: [id number]  Chief Complaint  Patient presents with  . Follow-up    enlarged lymph node    HPI Krystal Hardy is a 35 y.o. female.  Here for follow-up on abdominal pain, gallstones and a perigastric mass/lymph node. Patient had a ct scan done on 07/05/15.  Having postprandial right upper abdominal pain radiating to back and epigastrim.  Gallstones are present. On her prior evaluation she had declined cholecystectomy. She had tried some herbal medicines in [place] where she went for 1 mo-may have helped a little. Last few weeks she has been having more frequent attacks of pain. I have reviewed the history of present illness with the patient. HPI  Past Medical History  Diagnosis Date  . Allergy   . Medical history non-contributory   . Asthma     Past Surgical History  Procedure Laterality Date  . No past surgeries    . Upper esophageal endoscopic ultrasound (eus) N/A 03/24/2015    Procedure: UPPER ESOPHAGEAL ENDOSCOPIC ULTRASOUND (EUS);  Surgeon: Jola Schmidt, MD;  Location: [facility] ENDOSCOPY;  Service: Endoscopy;  Laterality: N/A;    Family History  Problem Relation Age of Onset  . Breast cancer Maternal Aunt     Social History Social History  Substance Use Topics  . Smoking status: Never Smoker   . Smokeless tobacco: Never Used  . Alcohol Use: No    No Known Allergies  Current Outpatient Prescriptions  Medication Sig Dispense Refill  . cetirizine (ZYRTEC) 10 MG tablet Take 10 mg by mouth daily.    Marland Kitchen etodolac (LODINE) 400 MG tablet Take 1 tablet (400 mg total) by mouth 2 (two) times daily. 60 tablet 3  . fluticasone (FLONASE) 50 MCG/ACT nasal spray Place 1 spray into both nostrils daily.  0   No current facility-administered medications for this visit.    Review of Systems Review of Systems  Constitutional: Negative.   Respiratory: Negative.   Cardiovascular: Negative.     Blood pressure 100/68, pulse 72, resp.  rate 12, height 5\' 2"  (1.575 m), weight 137 lb (62.143 kg), last menstrual period 07/05/2015.  Physical Exam Physical Exam  Constitutional: She is oriented to person, place, and time. She appears well-developed and well-nourished.  Eyes: Conjunctivae and EOM are normal. No scleral icterus.  Neck: Neck supple.  Cardiovascular: Normal rate, regular rhythm and normal heart sounds.   Pulmonary/Chest: Effort normal and breath sounds normal.  Abdominal: Soft. Bowel sounds are normal. There is no hepatomegaly. There is no tenderness.    Lymphadenopathy:    She has no cervical adenopathy.  Neurological: She is alert and oriented to person, place, and time.  Skin: Skin is warm and dry.    Data Reviewed Prior notes, CT,US  Assessment    Biliary colic, gallstones, perigastric mass/lymph nde  As discussed before cholecystectomy is recommended again. At same time excision or biopsy of the perigastric mass can be accomplished. All of this was discussed fully with pt before on her last OV. She is agreeable to proceed  Plan   Laparoscopic Cholecystectomy with Intraoperative Cholangiogram. The procedure, including it's potential risks and complications (including but not limited to infection, bleeding, injury to intra-abdominal organs or bile ducts, bile leak, poor cosmetic result, sepsis and death) were discussed with the patient in detail. Non-operative options, including their inherent risks (acute calculous cholecystitis with possible choledocholithiasis or gallstone pancreatitis, with the risk of ascending cholangitis, sepsis, and death) were  discussed as well. The patient expressed and understanding of what we discussed and wishes to proceed with laparoscopic cholecystectomy. The patient further understands that if it is technically not possible, or it is unsafe to proceed laparoscopically, that I will convert to an open cholecystectomy.   Patient's surgery has been scheduled for 07-21-15 at [facility].       PCP:  Ronnald Ramp This information has been scribed by Gaspar Cola CMA.   SANKAR,SEEPLAPUTHUR G 07/11/2015, 2:17 PM

## 2015-07-11 NOTE — Addendum Note (Signed)
Addended by: Christene Lye on: 07/11/2015 02:36 PM   Modules accepted: Orders

## 2015-07-11 NOTE — Patient Instructions (Addendum)
Scheduling Gallbladder surgery.  Patient's surgery has been scheduled for 07-21-15 at [facility]. Laparoscopic Cholecystectomy Laparoscopic cholecystectomy is surgery to remove the gallbladder. The gallbladder is located in the upper right part of the abdomen, behind the liver. It is a storage sac for bile, which is produced in the liver. Bile aids in the digestion and absorption of fats. Cholecystectomy is often done for inflammation of the gallbladder (cholecystitis). This condition is usually caused by a buildup of gallstones (cholelithiasis) in the gallbladder. Gallstones can block the flow of bile, and that can result in inflammation and pain. In severe cases, emergency surgery may be required. If emergency surgery is not required, you will have time to prepare for the procedure. Laparoscopic surgery is an alternative to open surgery. Laparoscopic surgery has a shorter recovery time. Your common bile duct may also need to be examined during the procedure. If stones are found in the common bile duct, they may be removed. LET [facility] CARE PROVIDER KNOW ABOUT:  Any allergies you have.  All medicines you are taking, including vitamins, herbs, eye drops, creams, and over-the-counter medicines.  Previous problems you or members of your family have had with the use of anesthetics.  Any blood disorders you have.  Previous surgeries you have had.  Any medical conditions you have. RISKS AND COMPLICATIONS Generally, this is a safe procedure. However, problems may occur, including:  Infection.  Bleeding.  Allergic reactions to medicines.  Damage to other structures or organs.  A stone remaining in the common bile duct.  A bile leak from the cyst duct that is clipped when your gallbladder is removed.  The need to convert to open surgery, which requires a larger incision in the abdomen. This may be necessary if your surgeon thinks that it is not safe to continue with a laparoscopic  procedure. BEFORE THE PROCEDURE  Ask your health care provider about:  Changing or stopping your regular medicines. This is especially important if you are taking diabetes medicines or blood thinners.  Taking medicines such as aspirin and ibuprofen. These medicines can thin your blood. Do not take these medicines before your procedure if your health care provider instructs you not to.  Follow instructions from your health care provider about eating or drinking restrictions.  Let your health care provider know if you develop a cold or an infection before surgery.  Plan to have someone take you home after the procedure.  Ask your health care provider how your surgical site will be marked or identified.  You may be given antibiotic medicine to help prevent infection. PROCEDURE  To reduce your risk of infection:  Your health care team will wash or sanitize their hands.  Your skin will be washed with soap.  An IV tube may be inserted into one of your veins.  You will be given a medicine to make you fall asleep (general anesthetic).  A breathing tube will be placed in your mouth.  The surgeon will make several small cuts (incisions) in your abdomen.  A thin, lighted tube (laparoscope) that has a tiny camera on the end will be inserted through one of the small incisions. The camera on the laparoscope will send a picture to a TV screen (monitor) in the operating room. This will give the surgeon a good view inside your abdomen.  A gas will be pumped into your abdomen. This will expand your abdomen to give the surgeon more room to perform the surgery.  Other tools that are needed  for the procedure will be inserted through the other incisions. The gallbladder will be removed through one of the incisions.  After your gallbladder has been removed, the incisions will be closed with stitches (sutures), staples, or skin glue.  Your incisions may be covered with a bandage (dressing). The  procedure may vary among health care providers and hospitals. AFTER THE PROCEDURE  Your blood pressure, heart rate, breathing rate, and blood oxygen level will be monitored often until the medicines you were given have worn off.  You will be given medicines as needed to control your pain.   This information is not intended to replace advice given to you by your health care provider. Make sure you discuss any questions you have with your health care provider.   Document Released: 03/26/2005 Document Revised: 12/15/2014 Document Reviewed: 11/05/2012 Elsevier Interactive Patient Education [employer].

## 2015-07-15 ENCOUNTER — Encounter: Payer: Self-pay | Admitting: *Deleted

## 2015-07-15 ENCOUNTER — Inpatient Hospital Stay: Admission: RE | Admit: 2015-07-15 | Payer: BLUE CROSS/BLUE SHIELD | Source: Ambulatory Visit

## 2015-07-15 NOTE — Patient Instructions (Signed)
  Your procedure is scheduled on: 07-21-15 Report to [place]. To find out your arrival time please call [phone] between 1PM - 3PM on 07-20-15  Remember: Instructions that are not followed completely may result in serious medical risk, up to and including death, or upon the discretion of your surgeon and anesthesiologist your surgery may need to be rescheduled.    _X___ 1. Do not eat food or drink liquids after midnight. No gum chewing or hard candies.     _X___ 2. No Alcohol for 24 hours before or after surgery.   ____ 3. Bring all medications with you on the day of surgery if instructed.    ____ 4. Notify your doctor if there is any change in your medical condition     (cold, fever, infections).     Do not wear jewelry, make-up, hairpins, clips or nail polish.  Do not wear lotions, powders, or perfumes. You may wear deodorant.  Do not shave 48 hours prior to surgery. Men may shave face and neck.  Do not bring valuables to the hospital.    [facility] is not responsible for any belongings or valuables.               Contacts, dentures or bridgework may not be worn into surgery.  Leave your suitcase in the car. After surgery it may be brought to your room.  For patients admitted to the hospital, discharge time is determined by your treatment team.   Patients discharged the day of surgery will not be allowed to drive home.   Please read over the following fact sheets that you were given:     ____ Take these medicines the morning of surgery with A SIP OF WATER:    1. NONE  2.   3.   4.  5.  6.  ____ Fleet Enema (as directed)   ____ Use CHG Soap as directed  ____ Use inhalers on the day of surgery  ____ Stop metformin 2 days prior to surgery    ____ Take 1/2 of usual insulin dose the night before surgery and none on the morning of surgery.   ____ Stop Coumadin/Plavix/aspirin-N/A  _X___ Stop Anti-inflammatories-STOP ETODOLAC AND ADVIL  NOW-NO NSAIDS OR ASA PRODUCTS-TYLENOL OK TO TAKE   ____ Stop supplements until after surgery.    ____ Bring C-Pap to the hospital.

## 2015-07-19 ENCOUNTER — Encounter: Payer: Self-pay | Admitting: Emergency Medicine

## 2015-07-19 ENCOUNTER — Emergency Department
Admission: EM | Admit: 2015-07-19 | Discharge: 2015-07-19 | Disposition: A | Discharge status: Home / Self Care | Payer: BLUE CROSS/BLUE SHIELD | Attending: Emergency Medicine | Admitting: Emergency Medicine

## 2015-07-19 DIAGNOSIS — Z791 Long term (current) use of non-steroidal anti-inflammatories (NSAID): Secondary | ICD-10-CM | POA: Diagnosis not present

## 2015-07-19 DIAGNOSIS — R1010 Upper abdominal pain, unspecified: Secondary | ICD-10-CM

## 2015-07-19 DIAGNOSIS — K8050 Calculus of bile duct without cholangitis or cholecystitis without obstruction: Secondary | ICD-10-CM | POA: Insufficient documentation

## 2015-07-19 DIAGNOSIS — J45909 Unspecified asthma, uncomplicated: Secondary | ICD-10-CM | POA: Insufficient documentation

## 2015-07-19 LAB — URINALYSIS COMPLETE WITH MICROSCOPIC (ARMC ONLY)
BACTERIA UA: NONE SEEN
Bilirubin Urine: NEGATIVE
Glucose, UA: NEGATIVE mg/dL
HGB URINE DIPSTICK: NEGATIVE
Ketones, ur: NEGATIVE mg/dL
NITRITE: NEGATIVE
PROTEIN: NEGATIVE mg/dL
SPECIFIC GRAVITY, URINE: 1.005 (ref 1.005–1.030)
pH: 8 (ref 5.0–8.0)

## 2015-07-19 LAB — CBC WITH DIFFERENTIAL/PLATELET
BASOS ABS: 0 10*3/uL (ref 0–0.1)
BASOS PCT: 0 %
Eosinophils Absolute: 0 10*3/uL (ref 0–0.7)
Eosinophils Relative: 0 %
HEMATOCRIT: 34.9 % — AB (ref 35.0–47.0)
Hemoglobin: 12.2 g/dL (ref 12.0–16.0)
LYMPHS PCT: 13 %
Lymphs Abs: 1.3 10*3/uL (ref 1.0–3.6)
MCH: 31.3 pg (ref 26.0–34.0)
MCHC: 35.1 g/dL (ref 32.0–36.0)
MCV: 89.1 fL (ref 80.0–100.0)
Monocytes Absolute: 0.5 10*3/uL (ref 0.2–0.9)
Monocytes Relative: 5 %
NEUTROS ABS: 7.7 10*3/uL — AB (ref 1.4–6.5)
Neutrophils Relative %: 82 %
PLATELETS: 230 10*3/uL (ref 150–440)
RBC: 3.92 MIL/uL (ref 3.80–5.20)
RDW: 12.4 % (ref 11.5–14.5)
WBC: 9.5 10*3/uL (ref 3.6–11.0)

## 2015-07-19 LAB — COMPREHENSIVE METABOLIC PANEL
ALBUMIN: 3.5 g/dL (ref 3.5–5.0)
ALK PHOS: 50 U/L (ref 38–126)
ALT: 57 U/L — AB (ref 14–54)
ANION GAP: 4 — AB (ref 5–15)
AST: 79 U/L — AB (ref 15–41)
BUN: 12 mg/dL (ref 6–20)
CALCIUM: 8.1 mg/dL — AB (ref 8.9–10.3)
CO2: 23 mmol/L (ref 22–32)
Chloride: 110 mmol/L (ref 101–111)
Creatinine, Ser: 0.62 mg/dL (ref 0.44–1.00)
GFR calc Af Amer: >60 mL/min (ref >60)
GFR calc non Af Amer: >60 mL/min (ref >60)
GLUCOSE: 102 mg/dL — AB (ref 65–99)
Potassium: 3.3 mmol/L — ABNORMAL LOW (ref 3.5–5.1)
Sodium: 137 mmol/L (ref 135–145)
Total Bilirubin: 0.9 mg/dL (ref 0.3–1.2)
Total Protein: 6.8 g/dL (ref 6.5–8.1)

## 2015-07-19 LAB — LIPASE, BLOOD: Lipase: 29 U/L (ref 11–51)

## 2015-07-19 LAB — POCT PREGNANCY, URINE: PREG TEST UR: NEGATIVE

## 2015-07-19 MED ORDER — ONDANSETRON 4 MG PO TBDP: 4.0000 mg | ORAL_TABLET | Freq: Three times a day (TID) | ORAL | Status: DC | PRN

## 2015-07-19 MED ORDER — ONDANSETRON HCL 4 MG/2ML IJ SOLN
4.0000 mg | Freq: Once | INTRAMUSCULAR | Status: AC
  Administered 2015-07-19: 4 mg via INTRAVENOUS

## 2015-07-19 MED ORDER — ONDANSETRON 4 MG PO TBDP
4.0000 mg | ORAL_TABLET | Freq: Once | ORAL | Status: AC
  Administered 2015-07-19: 4 mg via ORAL

## 2015-07-19 MED ORDER — ONDANSETRON HCL 4 MG/2ML IJ SOLN
INTRAMUSCULAR | Status: AC
  Administered 2015-07-19: 4 mg via INTRAVENOUS
  Filled 2015-07-19: qty 2

## 2015-07-19 MED ORDER — ONDANSETRON 4 MG PO TBDP
ORAL_TABLET | ORAL | Status: AC
  Administered 2015-07-19: 4 mg via ORAL
  Filled 2015-07-19: qty 1

## 2015-07-19 MED ORDER — OXYCODONE-ACETAMINOPHEN 5-325 MG PO TABS: 2.0000 | ORAL_TABLET | Freq: Four times a day (QID) | ORAL | Status: DC | PRN

## 2015-07-19 NOTE — ED Provider Notes (Signed)
[facility] Emergency Department Provider Note     Time seen: ----------------------------------------- 3:08 PM on 07/19/2015 -----------------------------------------    I have reviewed the triage vital signs and the nursing notes.   HISTORY  Chief Complaint No chief complaint on file.    HPI Krystal Hardy is a 35 y.o. female who presents to the ER for abdominal pain. Patient is here to see the general surgeon, she describes upper abdominal burning and does have a history of gallstones and possibly a gallbladder mass. She states today is the worst pain she is a head, she's been dealing with this for months. She took some Tylenol which helped her pain a little bit.   Past Medical History  Diagnosis Date  . Allergy   . Medical history non-contributory   . Anxiety     PT GETS VERY ANXIOUS WHEN SHE HAS ABDOMINAL PAIN-PT STATES SHE GETS SWEATY AND DIZZY-ONCE PAIN RESOLVES, SHE FEELS BETTER  . Asthma     -PT HAS NO INHALERS  . Joint pain   . Swelling of elbow joint   . Swelling of knee joint     There are no active problems to display for this patient.   Past Surgical History  Procedure Laterality Date  . No past surgeries    . Upper esophageal endoscopic ultrasound (eus) N/A 03/24/2015    Procedure: UPPER ESOPHAGEAL ENDOSCOPIC ULTRASOUND (EUS);  Surgeon: Jola Schmidt, MD;  Location[facility] ENDOSCOPY;  Service: Endoscopy;  Laterality: N/A;  . Lasik      Allergies Review of patient's allergies indicates no known allergies.  Social History Social History  Substance Use Topics  . Smoking status: Never Smoker   . Smokeless tobacco: Never Used  . Alcohol Use: No    Review of Systems Constitutional: Negative for fever. Eyes: Negative for visual changes. ENT: Negative for sore throat. Cardiovascular: Negative for chest pain. Respiratory: Negative for shortness of breath. Gastrointestinal: Positive for abdominal pain, negative for vomiting and  diarrhea Genitourinary: Negative for dysuria. Musculoskeletal: Negative for back pain. Skin: Negative for rash. Neurological: Negative for headaches, focal weakness or numbness.  10-point ROS otherwise negative.  ____________________________________________   PHYSICAL EXAM:  VITAL SIGNS: ED Triage Vitals  Enc Vitals Group     BP --      Pulse --      Resp --      Temp --      Temp src --      SpO2 --      Weight --      Height --      Head Cir --      Peak Flow --      Pain Score --      Pain Loc --      Pain Edu? --      Excl. in [place]? --     Constitutional: Alert and oriented. Well appearing and in no distress. Eyes: Conjunctivae are normal. PERRL. Normal extraocular movements. ENT   Head: Normocephalic and atraumatic.   Nose: No congestion/rhinnorhea.   Mouth/Throat: Mucous membranes are moist.   Neck: No stridor. Cardiovascular: Normal rate, regular rhythm. No murmurs, rubs, or gallops. Respiratory: Normal respiratory effort without tachypnea nor retractions. Breath sounds are clear and equal bilaterally. No wheezes/rales/rhonchi. Gastrointestinal: Mild upper abdominal tenderness, normal bowel sounds Musculoskeletal: Nontender with normal range of motion in all extremities. No lower extremity tenderness nor edema. Neurologic:  Normal speech and language. No gross focal neurologic deficits are appreciated.  Skin:  Skin is warm, dry and intact. No rash noted. Psychiatric: Mood and affect are normal. Speech and behavior are normal.  ____________________________________________  ED COURSE:  Pertinent labs & imaging results that were available during my care of the patient were reviewed by me and considered in my medical decision making (see chart for details).  Patient is no acute distress, will check basic labs and discuss with general surgery.  ____________________________________________    LABS (pertinent positives/negatives)  Labs Reviewed  CBC  WITH DIFFERENTIAL/PLATELET - Abnormal; Notable for the following:    HCT 34.9 (*)    Neutro Abs 7.7 (*)    All other components within normal limits  COMPREHENSIVE METABOLIC PANEL - Abnormal; Notable for the following:    Potassium 3.3 (*)    Glucose, Bld 102 (*)    Calcium 8.1 (*)    AST 79 (*)    ALT 57 (*)    Anion gap 4 (*)    All other components within normal limits  URINALYSIS COMPLETEWITH MICROSCOPIC ([facility] ONLY) - Abnormal; Notable for the following:    Color, Urine STRAW (*)    APPearance CLEAR (*)    Leukocytes, UA TRACE (*)    Squamous Epithelial / LPF 0-5 (*)    All other components within normal limits  LIPASE, BLOOD  POC URINE PREG, ED  POCT PREGNANCY, URINE   ____________________________________________  FINAL ASSESSMENT AND PLAN   Biliary colic   Plan: Patient with labs as dictated above. Patient with chronically elevated liver transaminase levels. This is stable from December. I discussed the patient with Dr. Jamal Collin who recommends continued outpatient follow-up. She has surgery scheduled on Thursday. She'll be discharged with pain medicine and antiemetics.   Earleen Newport, MD   Earleen Newport, MD 07/19/15 [phone]

## 2015-07-19 NOTE — ED Notes (Signed)
Discussed discharge instructions, prescriptions, and follow-up care with patient and pt's family, with pt's permission. No questions or concerns at this time. Pt stable at discharge.

## 2015-07-19 NOTE — ED Notes (Signed)
Pt presents to ED via EMS from GI MD office c/o upper abdominal pain. Hx gallstones and abdominal mass, scheduled for GI surgery this Thursday to remove gallstones and biopsy, sent for pain control. Given 168mcg fentanyl and 577mL NS PTA by EMS. C/o headache. MD at bedside upon EMS arrival.

## 2015-07-19 NOTE — ED Notes (Signed)
Pt called nurse to have her IV removed so she could go home. A few minutes after removal, pt vomited small amount yellow fluid. MD notified, order received for x1 PO Zofran. Pt at this time does not wish to stay in ED and states she would rather manage at home. MD aware.

## 2015-07-19 NOTE — Discharge Instructions (Signed)
Biliary Colic °Biliary colic is a pain in the upper abdomen. The pain: °· Is usually felt on the right side of the abdomen, but it may also be felt in the center of the abdomen, just below the breastbone (sternum). °· May spread back toward the right shoulder blade. °· May be steady or irregular. °· May be accompanied by nausea and vomiting. °Most of the time, the pain goes away in 1-5 hours. After the most intense pain passes, the abdomen may continue to ache mildly for about 24 hours. °Biliary colic is caused by a blockage in the bile duct. The bile duct is a pathway that carries bile--a liquid that helps to digest fats--from the gallbladder to the small intestine. Biliary colic usually occurs after eating, when the digestive system demands bile. The pain develops when muscle cells contract forcefully to try to move the blockage so that bile can get by. °HOME CARE INSTRUCTIONS °· Take medicines only as directed by your health care provider. °· Drink enough fluid to keep your urine clear or pale yellow. °· Avoid fatty, greasy, and fried foods. These kinds of foods increase your body's demand for bile. °· Avoid any foods that make your pain worse. °· Avoid overeating. °· Avoid having a large meal after fasting. °SEEK MEDICAL CARE IF: °· You develop a fever. °· Your pain gets worse. °· You vomit. °· You develop nausea that prevents you from eating and drinking. °SEEK IMMEDIATE MEDICAL CARE IF: °· You suddenly develop a fever and shaking chills. °· You develop a yellowish discoloration (jaundice) of: °¨ Skin. °¨ Whites of the eyes. °¨ Mucous membranes. °· You have continuous or severe pain that is not relieved with medicines. °· You have nausea and vomiting that is not relieved with medicines. °· You develop dizziness or you faint. °  °This information is not intended to replace advice given to you by your health care provider. Make sure you discuss any questions you have with your health care provider. °  °Document  Released: 08/27/2005 Document Revised: 08/10/2014 Document Reviewed: 01/05/2014 °Elsevier Interactive Patient Education ©2016 Elsevier Inc[employer] ° °

## 2015-07-19 NOTE — ED Notes (Signed)
Pt vomiting large amount of food

## 2015-07-20 ENCOUNTER — Other Ambulatory Visit: Payer: Self-pay

## 2015-07-21 ENCOUNTER — Ambulatory Visit: Payer: BLUE CROSS/BLUE SHIELD

## 2015-07-21 ENCOUNTER — Encounter: Payer: Self-pay | Admitting: *Deleted

## 2015-07-21 ENCOUNTER — Ambulatory Visit
Admission: RE | Admit: 2015-07-21 | Discharge: 2015-07-21 | Disposition: A | Discharge status: Home / Self Care | Payer: BLUE CROSS/BLUE SHIELD | Source: Ambulatory Visit | Attending: General Surgery | Admitting: General Surgery

## 2015-07-21 ENCOUNTER — Ambulatory Visit: Payer: BLUE CROSS/BLUE SHIELD | Admitting: Anesthesiology

## 2015-07-21 ENCOUNTER — Encounter
Admission: RE | Disposition: A | Discharge status: Home / Self Care | Payer: Self-pay | Source: Ambulatory Visit | Attending: General Surgery

## 2015-07-21 DIAGNOSIS — K8010 Calculus of gallbladder with chronic cholecystitis without obstruction: Secondary | ICD-10-CM | POA: Diagnosis not present

## 2015-07-21 DIAGNOSIS — M25429 Effusion, unspecified elbow: Secondary | ICD-10-CM | POA: Insufficient documentation

## 2015-07-21 DIAGNOSIS — Z419 Encounter for procedure for purposes other than remedying health state, unspecified: Secondary | ICD-10-CM

## 2015-07-21 DIAGNOSIS — M25469 Effusion, unspecified knee: Secondary | ICD-10-CM | POA: Diagnosis not present

## 2015-07-21 DIAGNOSIS — J45909 Unspecified asthma, uncomplicated: Secondary | ICD-10-CM | POA: Insufficient documentation

## 2015-07-21 DIAGNOSIS — Z803 Family history of malignant neoplasm of breast: Secondary | ICD-10-CM | POA: Diagnosis not present

## 2015-07-21 DIAGNOSIS — K8020 Calculus of gallbladder without cholecystitis without obstruction: Secondary | ICD-10-CM

## 2015-07-21 DIAGNOSIS — F419 Anxiety disorder, unspecified: Secondary | ICD-10-CM | POA: Insufficient documentation

## 2015-07-21 DIAGNOSIS — Z79899 Other long term (current) drug therapy: Secondary | ICD-10-CM | POA: Diagnosis not present

## 2015-07-21 DIAGNOSIS — R1900 Intra-abdominal and pelvic swelling, mass and lump, unspecified site: Secondary | ICD-10-CM

## 2015-07-21 HISTORY — DX: Anxiety disorder, unspecified: F41.9

## 2015-07-21 HISTORY — PX: LAPAROSCOPIC REMOVAL OF MESENTERIC MASS: SHX5917

## 2015-07-21 HISTORY — PX: CHOLECYSTECTOMY: SHX55

## 2015-07-21 HISTORY — DX: Effusion, unspecified knee: M25.469

## 2015-07-21 HISTORY — DX: Effusion, unspecified elbow: M25.429

## 2015-07-21 HISTORY — DX: Pain in unspecified joint: M25.50

## 2015-07-21 LAB — POCT PREGNANCY, URINE: Preg Test, Ur: NEGATIVE

## 2015-07-21 SURGERY — LAPAROSCOPIC CHOLECYSTECTOMY
Anesthesia: General | Wound class: Clean Contaminated

## 2015-07-21 MED ORDER — PROPOFOL 10 MG/ML IV BOLUS
INTRAVENOUS | Status: DC | PRN
  Administered 2015-07-21: 100 mg via INTRAVENOUS

## 2015-07-21 MED ORDER — MIDAZOLAM HCL 2 MG/2ML IJ SOLN
INTRAMUSCULAR | Status: DC | PRN
  Administered 2015-07-21: 2 mg via INTRAVENOUS

## 2015-07-21 MED ORDER — LACTATED RINGERS IV SOLN
INTRAVENOUS | Status: DC
  Administered 2015-07-21 (×2): via INTRAVENOUS

## 2015-07-21 MED ORDER — ONDANSETRON HCL 4 MG/2ML IJ SOLN
INTRAMUSCULAR | Status: DC | PRN
  Administered 2015-07-21: 4 mg via INTRAVENOUS

## 2015-07-21 MED ORDER — FENTANYL CITRATE (PF) 100 MCG/2ML IJ SOLN
25.0000 ug | INTRAMUSCULAR | Status: DC | PRN
  Administered 2015-07-21: 25 ug via INTRAVENOUS
  Administered 2015-07-21: 50 ug via INTRAVENOUS
  Administered 2015-07-21 (×2): 25 ug via INTRAVENOUS

## 2015-07-21 MED ORDER — HYDROCODONE-ACETAMINOPHEN 5-325 MG PO TABS
ORAL_TABLET | ORAL | Status: AC
  Filled 2015-07-21: qty 1

## 2015-07-21 MED ORDER — FENTANYL CITRATE (PF) 100 MCG/2ML IJ SOLN
INTRAMUSCULAR | Status: AC
  Administered 2015-07-21: 50 ug via INTRAVENOUS
  Filled 2015-07-21: qty 2

## 2015-07-21 MED ORDER — SODIUM CHLORIDE 0.9 % IJ SOLN
INTRAMUSCULAR | Status: AC
  Filled 2015-07-21: qty 50

## 2015-07-21 MED ORDER — ONDANSETRON HCL 4 MG/2ML IJ SOLN
4.0000 mg | Freq: Once | INTRAMUSCULAR | Status: AC | PRN
  Administered 2015-07-21: 4 mg via INTRAVENOUS

## 2015-07-21 MED ORDER — ONDANSETRON HCL 4 MG/2ML IJ SOLN
INTRAMUSCULAR | Status: AC
  Administered 2015-07-21: 4 mg via INTRAVENOUS
  Filled 2015-07-21: qty 2

## 2015-07-21 MED ORDER — FAMOTIDINE 20 MG PO TABS
ORAL_TABLET | ORAL | Status: AC
  Filled 2015-07-21: qty 1

## 2015-07-21 MED ORDER — CEFAZOLIN SODIUM-DEXTROSE 2-4 GM/100ML-% IV SOLN
2.0000 g | INTRAVENOUS | Status: AC
  Administered 2015-07-21: 2 g via INTRAVENOUS

## 2015-07-21 MED ORDER — PROMETHAZINE HCL 25 MG RE SUPP
25.0000 mg | Freq: Once | RECTAL | Status: AC
  Administered 2015-07-21: 25 mg via RECTAL

## 2015-07-21 MED ORDER — SODIUM CHLORIDE 0.9 % IV SOLN
INTRAVENOUS | Status: DC | PRN
  Administered 2015-07-21: 25 mL

## 2015-07-21 MED ORDER — FAMOTIDINE 20 MG PO TABS
20.0000 mg | ORAL_TABLET | Freq: Once | ORAL | Status: AC
  Administered 2015-07-21: 20 mg via ORAL

## 2015-07-21 MED ORDER — SCOPOLAMINE 1 MG/3DAYS TD PT72
1.0000 | MEDICATED_PATCH | Freq: Once | TRANSDERMAL | Status: DC
  Administered 2015-07-21: 1.5 mg via TRANSDERMAL

## 2015-07-21 MED ORDER — LIDOCAINE HCL (CARDIAC) 20 MG/ML IV SOLN
INTRAVENOUS | Status: DC | PRN
  Administered 2015-07-21: 30 mg via INTRAVENOUS

## 2015-07-21 MED ORDER — EPHEDRINE SULFATE 50 MG/ML IJ SOLN
INTRAMUSCULAR | Status: DC | PRN
  Administered 2015-07-21: 10 mg via INTRAVENOUS

## 2015-07-21 MED ORDER — SCOPOLAMINE 1 MG/3DAYS TD PT72
MEDICATED_PATCH | TRANSDERMAL | Status: AC
  Filled 2015-07-21: qty 1

## 2015-07-21 MED ORDER — CHLORHEXIDINE GLUCONATE 4 % EX LIQD: 1.0000 "application " | Freq: Once | CUTANEOUS | Status: DC

## 2015-07-21 MED ORDER — CEFAZOLIN SODIUM-DEXTROSE 2-4 GM/100ML-% IV SOLN
INTRAVENOUS | Status: AC
  Filled 2015-07-21: qty 100

## 2015-07-21 MED ORDER — FENTANYL CITRATE (PF) 100 MCG/2ML IJ SOLN
INTRAMUSCULAR | Status: DC | PRN
  Administered 2015-07-21: 25 ug via INTRAVENOUS
  Administered 2015-07-21: 50 ug via INTRAVENOUS

## 2015-07-21 MED ORDER — PROMETHAZINE HCL 25 MG RE SUPP
RECTAL | Status: AC
  Administered 2015-07-21: 25 mg via RECTAL
  Filled 2015-07-21: qty 1

## 2015-07-21 MED ORDER — HYDROCODONE-ACETAMINOPHEN 5-325 MG PO TABS
1.0000 | ORAL_TABLET | ORAL | Status: DC | PRN
  Administered 2015-07-21: 1 via ORAL

## 2015-07-21 MED ORDER — ROCURONIUM BROMIDE 100 MG/10ML IV SOLN
INTRAVENOUS | Status: DC | PRN
  Administered 2015-07-21: 30 mg via INTRAVENOUS
  Administered 2015-07-21 (×2): 10 mg via INTRAVENOUS
  Administered 2015-07-21: 5 mg via INTRAVENOUS

## 2015-07-21 MED ORDER — DEXAMETHASONE SODIUM PHOSPHATE 10 MG/ML IJ SOLN
8.0000 mg | Freq: Once | INTRAMUSCULAR | Status: AC
  Administered 2015-07-21: 8 mg via INTRAVENOUS

## 2015-07-21 MED ORDER — DEXAMETHASONE SODIUM PHOSPHATE 10 MG/ML IJ SOLN
INTRAMUSCULAR | Status: AC
  Administered 2015-07-21: 8 mg via INTRAVENOUS
  Filled 2015-07-21: qty 1

## 2015-07-21 MED ORDER — NEOSTIGMINE METHYLSULFATE 10 MG/10ML IV SOLN
INTRAVENOUS | Status: DC | PRN
  Administered 2015-07-21: 3 mg via INTRAVENOUS

## 2015-07-21 MED ORDER — FENTANYL CITRATE (PF) 100 MCG/2ML IJ SOLN
INTRAMUSCULAR | Status: AC
  Administered 2015-07-21: 25 ug via INTRAVENOUS
  Filled 2015-07-21: qty 2

## 2015-07-21 MED ORDER — GLYCOPYRROLATE 0.2 MG/ML IJ SOLN
INTRAMUSCULAR | Status: DC | PRN
  Administered 2015-07-21: 0.6 mg via INTRAVENOUS

## 2015-07-21 MED ORDER — PROMETHAZINE HCL 25 MG RE SUPP
RECTAL | Status: AC
  Filled 2015-07-21: qty 1

## 2015-07-21 SURGICAL SUPPLY — 37 items
ANCHOR TIS RET SYS 235ML (MISCELLANEOUS) IMPLANT
APPLICATOR SURGIFLO (MISCELLANEOUS) IMPLANT
APPLIER CLIP LOGIC TI 5 (MISCELLANEOUS) ×2 IMPLANT
BLADE SURG 11 STRL SS SAFETY (MISCELLANEOUS) ×2 IMPLANT
CANISTER SUCT 1200ML W/VALVE (MISCELLANEOUS) ×2 IMPLANT
CANNULA DILATOR 10 W/SLV (CANNULA) ×2 IMPLANT
CATH CHOLANG 76X19 KUMAR (CATHETERS) ×2 IMPLANT
CHLORAPREP W/TINT 26ML (MISCELLANEOUS) ×2 IMPLANT
DEFOGGER SCOPE WARMER CLEARIFY (MISCELLANEOUS) ×2 IMPLANT
DRAPE C-ARM XRAY 36X54 (DRAPES) ×2 IMPLANT
DRAPE INCISE IOBAN 66X45 STRL (DRAPES) ×2 IMPLANT
DRESSING TELFA 4X3 1S ST N-ADH (GAUZE/BANDAGES/DRESSINGS) ×2 IMPLANT
DRSG TEGADERM 2-3/8X2-3/4 SM (GAUZE/BANDAGES/DRESSINGS) ×8 IMPLANT
ELECT REM PT RETURN 9FT ADLT (ELECTROSURGICAL) ×2
ELECTRODE REM PT RTRN 9FT ADLT (ELECTROSURGICAL) ×1 IMPLANT
GLOVE BIO SURGEON STRL SZ7 (GLOVE) ×2 IMPLANT
GOWN STRL REUS W/ TWL LRG LVL3 (GOWN DISPOSABLE) ×3 IMPLANT
GOWN STRL REUS W/TWL LRG LVL3 (GOWN DISPOSABLE) ×3
GRASPER SUT TROCAR 14GX15 (MISCELLANEOUS) ×2 IMPLANT
HEMOSTAT SURGICEL 2X3 (HEMOSTASIS) IMPLANT
IRRIGATION STRYKERFLOW (MISCELLANEOUS) ×1 IMPLANT
IRRIGATOR STRYKERFLOW (MISCELLANEOUS) ×2
IV LACTATED RINGERS 1000ML (IV SOLUTION) ×2 IMPLANT
KIT RM TURNOVER STRD PROC AR (KITS) ×2 IMPLANT
LABEL OR SOLS (LABEL) ×2 IMPLANT
NDL INSUFF ACCESS 14 VERSASTEP (NEEDLE) ×2 IMPLANT
PACK LAP CHOLECYSTECTOMY (MISCELLANEOUS) ×2 IMPLANT
SCISSORS METZENBAUM CVD 33 (INSTRUMENTS) ×2 IMPLANT
SLEEVE ENDOPATH XCEL 5M (ENDOMECHANICALS) ×4 IMPLANT
SPOGE SURGIFLO 8M (HEMOSTASIS)
SPONGE SURGIFLO 8M (HEMOSTASIS) IMPLANT
STRIP CLOSURE SKIN 1/2X4 (GAUZE/BANDAGES/DRESSINGS) ×2 IMPLANT
SUT VIC AB 0 SH 27 (SUTURE) ×2 IMPLANT
SUT VIC AB 4-0 FS2 27 (SUTURE) ×2 IMPLANT
SWABSTK COMLB BENZOIN TINCTURE (MISCELLANEOUS) ×2 IMPLANT
TROCAR XCEL NON-BLD 5MMX100MML (ENDOMECHANICALS) ×2 IMPLANT
TUBING INSUFFLATOR HI FLOW (MISCELLANEOUS) ×2 IMPLANT

## 2015-07-21 NOTE — Interval H&P Note (Signed)
History and Physical Interval Note:  07/21/2015 7:48 AM  Krystal Hardy  has presented today for surgery, with the diagnosis of GALLSTONES,PERIGASTRIC MASS  The various methods of treatment have been discussed with the patient and family. After consideration of risks, benefits and other options for treatment, the patient has consented to  Procedure(s): LAPAROSCOPIC CHOLECYSTECTOMY (N/A) LAPAROSCOPIC REMOVAL OF MESENTERIC MASS/ BIOPSY (N/A) as a surgical intervention .  The patient's history has been reviewed, patient examined, no change in status, stable for surgery.  I have reviewed the patient's chart and labs.  Questions were answered to the patient's satisfaction.     Ruwayda Curet G

## 2015-07-21 NOTE — Progress Notes (Signed)
Per Arvilla Meres, RN pt may leae her hat on head under OR cap

## 2015-07-21 NOTE — Progress Notes (Signed)
Remains nauseated dr Jamal Collin aware  No admission order

## 2015-07-21 NOTE — Discharge Instructions (Signed)
Laparoscopic Cholecystectomy, Care After Refer to this sheet in the next few weeks. These instructions provide you with information about caring for yourself after your procedure. Your health care provider may also give you more specific instructions. Your treatment has been planned according to current medical practices, but problems sometimes occur. Call your health care provider if you have any problems or questions after your procedure. WHAT TO EXPECT AFTER THE PROCEDURE After your procedure, it is common to have:  Pain at your incision sites. You will be given pain medicines to control your pain.  Mild nausea or vomiting. This should improve after the first 24 hours.  Bloating and possible shoulder pain from the gas that was used during the procedure. This will improve after the first 24 hours. HOME CARE INSTRUCTIONS Incision Care  Follow instructions from your health care provider about how to take care of your incisions. Make sure you:  Wash your hands with soap and water before you change your bandage (dressing). If soap and water are not available, use hand sanitizer.  Change your dressing as told by your health care provider.  Leave stitches (sutures), skin glue, or adhesive strips in place. These skin closures may need to be in place for 2 weeks or longer. If adhesive strip edges start to loosen and curl up, you may trim the loose edges. Do not remove adhesive strips completely unless your health care provider tells you to do that.  Do not take baths, swim, or use a hot tub until your health care provider approves. Ask your health care provider if you can take showers. You may only be allowed to take sponge baths for bathing. General Instructions  Take over-the-counter and prescription medicines only as told by your health care provider.  Do not drive or operate heavy machinery while taking prescription pain medicine.  Return to your normal diet as told by your health care  provider.  Do not lift anything that is heavier than 10 lb (4.5 kg).  Do not play contact sports for one week or until your health care provider approves. SEEK MEDICAL CARE IF:   You have redness, swelling, or pain at the site of your incision.  You have fluid, blood, or pus coming from your incision.  You notice a bad smell coming from your incision area.  Your surgical incisions break open.  You have a fever. SEEK IMMEDIATE MEDICAL CARE IF:  You develop a rash.  You have difficulty breathing.  You have chest pain.  You have increasing pain in your shoulders (shoulder strap areas).  You faint or have dizzy episodes while you are standing.  You have severe pain in your abdomen.  You have nausea or vomiting that lasts for more than one day.   This information is not intended to replace advice given to you by your health care provider. Make sure you discuss any questions you have with your health care provider.   Document Released: 03/26/2005 Document Revised: 12/15/2014 Document Reviewed: 11/05/2012 Elsevier Interactive Patient Education 2016 [place]   1) The drugs that you were given will stay in your system until tomorrow so for the next 24 hours you should not:  A) Drive an automobile B) Make any legal decisions C) Drink any alcoholic beverage   2) You may resume regular meals tomorrow.  Today it is better to start with liquids and gradually work up to solid foods.  You may eat anything you prefer, but  it is better to start with liquids, then soup and crackers, and gradually work up to solid foods.   3) Please notify your doctor immediately if you have any unusual bleeding, trouble breathing, redness and pain at the surgery site, drainage, fever, or pain not relieved by medication.   Please contact your physician with any problems or Same Day Surgery at [phone], Monday through Friday 6 am to 4 pm, or [facility] number at [phone].

## 2015-07-21 NOTE — Op Note (Signed)
Preop diagnosis: Chronic cholecystitis and cholelithiasis. Perigastric mass seen on CT  Post op diagnosis: Chronic cholecystitis and cholelithiasis. No finding in the perigastric area along the lesser curvature as noted on CT.  Operation: Laparoscopy cholecystectomy exploration of the lesser curvature of the stomach and the perigastric space  Surgeon: S.G.Shaelynn Dragos  Assistant:     Anesthesia: Gen.  Complications: None  EBL: Less than  25 mL  Drains: None  Description: Patient was put to sleep in the supine position the operating table, timeout was performed and the abdomen prepped and draped sterile field. Port incision was made just below the umbilicus and a Veress needle with the InnerDyne sleeve was position in the peritoneal cavity this was verified of the hanging drop method. Pneumoperitoneum was obtained and 10 mm port was placed. Camera was introduced with good visualization of peritoneal cavity and epigastric and 2 lateral 5 mm ports were then placed. The gallbladder was noted be mildly distended but showed no acute changes. Adequate cephalad traction the Hartman's pouch area was pulled up. The common bile duct appeared to be mildly dilated and with appeared that cystic duct was fairly short. With careful dissection the cystic artery was identified first along the cystic duct node the artery was freed hemoclipped and cut. Kumar clamp and catheter were then positioned and cholangiogram was completed which showed that the common bile duct did appear to be mildly dilated annulus and there was somewhat of a slow transit but it did empty into the duodenum. No apparent filling defects were noted. The catheter was used to decompress the gallbladder was removed. The cystic duct was hemoclipped and cut. Gallbladder was dissected free from its bed using cautery for control of bleeding. Small amount of fluid was used to irrigate out our upper quadrant and suctioned out. Gallbladder was placed in the  retrieval bag and brought out through the umbilical port site and noted contain multiple tiny cholesterol stones. Subsequent to this is exploration of the gastric region was performed. Prior CT had shown approximately a 2 cm soft tissue mass lying along the lesser curvature area and behind the left lobe of the liver. This area was carefully inspected and the lesser omentum was freed part of the way to allow entry into the space behind it. There did not appear to be any visible mass in the perigastric region on this side as noted on the CT there was no apparent mass that was easily visible or identifiable with the instruments along the course of the stomach wall in this region. Given the fact that the CT finding a shown a small mass which had previously been biopsied with the endoscopic ultrasound with   no apparent malignant findings-it was felt reasonable to avoid any further open exploration at this time. The ports were then removed and pneumoperitoneum was released. Fascial opening in the umbilicus was closed with 0 Vicryl using a suture passer. All skin incisions were closed with subcuticular 4-0 Vicryl reinforced with Steri-Strips and tincture benzoin. Telfa and Tegaderm dressings were placed and the patient subsequently returned recovery room stable condition.

## 2015-07-21 NOTE — Anesthesia Preprocedure Evaluation (Signed)
Anesthesia Evaluation  Patient identified by MRN, date of birth, ID band Patient awake    Reviewed: Allergy & Precautions, H&P , NPO status , Patient's Chart, lab work & pertinent test results, reviewed documented beta blocker date and time   Airway Mallampati: I  TM Distance: >3 FB Neck ROM: full    Dental no notable dental hx. (+) Teeth Intact   Pulmonary neg shortness of breath, asthma , neg sleep apnea, neg COPD, neg recent URI,    Pulmonary exam normal breath sounds clear to auscultation       Cardiovascular Exercise Tolerance: Good negative cardio ROS Normal cardiovascular exam Rhythm:regular Rate:Normal     Neuro/Psych negative neurological ROS  negative psych ROS   GI/Hepatic negative GI ROS, Neg liver ROS,   Endo/Other  negative endocrine ROS  Renal/GU negative Renal ROS  negative genitourinary   Musculoskeletal   Abdominal   Peds  Hematology negative hematology ROS (+)   Anesthesia Other Findings Past Medical History:   Allergy                                                      Medical history non-contributory                             Anxiety                                                        Comment:PT GETS VERY ANXIOUS WHEN SHE HAS ABDOMINAL               PAIN-PT STATES SHE GETS SWEATY AND DIZZY-ONCE               PAIN RESOLVES, SHE FEELS BETTER   Asthma                                                         Comment:-PT HAS NO INHALERS   Joint pain                                                   Swelling of elbow joint                                      Swelling of knee joint                                       Reproductive/Obstetrics negative OB ROS                             Anesthesia Physical Anesthesia Plan  ASA: II  Anesthesia Plan: General   Post-op Pain Management:    Induction:   Airway Management Planned:   Additional Equipment:    Intra-op Plan:   Post-operative Plan:   Informed Consent: I have reviewed the patients History and Physical, chart, labs and discussed the procedure including the risks, benefits and alternatives for the proposed anesthesia with the patient or authorized representative who has indicated his/her understanding and acceptance.   Dental Advisory Given  Plan Discussed with: Anesthesiologist, CRNA and Surgeon  Anesthesia Plan Comments:         Anesthesia Quick Evaluation

## 2015-07-21 NOTE — H&P (View-Only) (Signed)
Patient ID: Krystal Hardy, female   DOB: November 20, 1980, 35 y.o.   MRN: [id number]  Chief Complaint  Patient presents with  . Follow-up    enlarged lymph node    HPI Krystal Hardy is a 35 y.o. female.  Here for follow-up on abdominal pain, gallstones and a perigastric mass/lymph node. Patient had a ct scan done on 07/05/15.  Having postprandial right upper abdominal pain radiating to back and epigastrim.  Gallstones are present. On her prior evaluation she had declined cholecystectomy. She had tried some herbal medicines in [place] where she went for 1 mo-may have helped a little. Last few weeks she has been having more frequent attacks of pain. I have reviewed the history of present illness with the patient. HPI  Past Medical History  Diagnosis Date  . Allergy   . Medical history non-contributory   . Asthma     Past Surgical History  Procedure Laterality Date  . No past surgeries    . Upper esophageal endoscopic ultrasound (eus) N/A 03/24/2015    Procedure: UPPER ESOPHAGEAL ENDOSCOPIC ULTRASOUND (EUS);  Surgeon: Jola Schmidt, MD;  Location: [facility] ENDOSCOPY;  Service: Endoscopy;  Laterality: N/A;    Family History  Problem Relation Age of Onset  . Breast cancer Maternal Aunt     Social History Social History  Substance Use Topics  . Smoking status: Never Smoker   . Smokeless tobacco: Never Used  . Alcohol Use: No    No Known Allergies  Current Outpatient Prescriptions  Medication Sig Dispense Refill  . cetirizine (ZYRTEC) 10 MG tablet Take 10 mg by mouth daily.    Marland Kitchen etodolac (LODINE) 400 MG tablet Take 1 tablet (400 mg total) by mouth 2 (two) times daily. 60 tablet 3  . fluticasone (FLONASE) 50 MCG/ACT nasal spray Place 1 spray into both nostrils daily.  0   No current facility-administered medications for this visit.    Review of Systems Review of Systems  Constitutional: Negative.   Respiratory: Negative.   Cardiovascular: Negative.     Blood pressure 100/68, pulse 72, resp.  rate 12, height 5\' 2"  (1.575 m), weight 137 lb (62.143 kg), last menstrual period 07/05/2015.  Physical Exam Physical Exam  Constitutional: She is oriented to person, place, and time. She appears well-developed and well-nourished.  Eyes: Conjunctivae and EOM are normal. No scleral icterus.  Neck: Neck supple.  Cardiovascular: Normal rate, regular rhythm and normal heart sounds.   Pulmonary/Chest: Effort normal and breath sounds normal.  Abdominal: Soft. Bowel sounds are normal. There is no hepatomegaly. There is no tenderness.    Lymphadenopathy:    She has no cervical adenopathy.  Neurological: She is alert and oriented to person, place, and time.  Skin: Skin is warm and dry.    Data Reviewed Prior notes, CT,US  Assessment    Biliary colic, gallstones, perigastric mass/lymph nde  As discussed before cholecystectomy is recommended again. At same time excision or biopsy of the perigastric mass can be accomplished. All of this was discussed fully with pt before on her last OV. She is agreeable to proceed  Plan   Laparoscopic Cholecystectomy with Intraoperative Cholangiogram. The procedure, including it's potential risks and complications (including but not limited to infection, bleeding, injury to intra-abdominal organs or bile ducts, bile leak, poor cosmetic result, sepsis and death) were discussed with the patient in detail. Non-operative options, including their inherent risks (acute calculous cholecystitis with possible choledocholithiasis or gallstone pancreatitis, with the risk of ascending cholangitis, sepsis, and death) were  discussed as well. The patient expressed and understanding of what we discussed and wishes to proceed with laparoscopic cholecystectomy. The patient further understands that if it is technically not possible, or it is unsafe to proceed laparoscopically, that I will convert to an open cholecystectomy.   Patient's surgery has been scheduled for 07-21-15 at [facility].        PCP:  Ronnald Ramp This information has been scribed by Gaspar Cola CMA.   SANKAR,SEEPLAPUTHUR G 07/11/2015, 2:17 PM

## 2015-07-21 NOTE — Anesthesia Postprocedure Evaluation (Signed)
Anesthesia Post Note  Patient: Krystal Hardy  Procedure(s) Performed: Procedure(s) (LRB): LAPAROSCOPIC CHOLECYSTECTOMY with cholangiogram (N/A) exploration of lesser curvature of the stomach (N/A)  Patient location during evaluation: PACU Anesthesia Type: General Level of consciousness: awake and alert Pain management: pain level controlled Vital Signs Assessment: post-procedure vital signs reviewed and stable Respiratory status: spontaneous breathing, nonlabored ventilation, respiratory function stable and patient connected to nasal cannula oxygen Cardiovascular status: blood pressure returned to baseline and stable Postop Assessment: no signs of nausea or vomiting Anesthetic complications: no    Last Vitals:  Filed Vitals:   07/21/15 1317 07/21/15 1343  BP: 107/78 113/64  Pulse: 71 77  Temp:    Resp: 14 14    Last Pain:  Filed Vitals:   07/21/15 1421  PainSc: 5                  Martha Clan

## 2015-07-21 NOTE — Transfer of Care (Signed)
Immediate Anesthesia Transfer of Care Note  Patient: Krystal Hardy  Procedure(s) Performed: Procedure(s): LAPAROSCOPIC CHOLECYSTECTOMY with cholangiogram (N/A) exploration of lesser curvature of the stomach (N/A)  Patient Location: PACU  Anesthesia Type:General  Level of Consciousness: awake and patient cooperative  Airway & Oxygen Therapy: Patient Spontanous Breathing and Patient connected to face mask oxygen  Post-op Assessment: Report given to RN and Post -op Vital signs reviewed and stable  Post vital signs: Reviewed and stable  Last Vitals:  Filed Vitals:   07/21/15 0657  BP: 97/64  Pulse: 72  Temp: 35.6 C  Resp: 16    Complications: No apparent anesthesia complications

## 2015-07-21 NOTE — Anesthesia Procedure Notes (Signed)
Procedure Name: Intubation Date/Time: 07/21/2015 8:42 AM Performed by: Courtney Paris Pre-anesthesia Checklist: Patient identified, Emergency Drugs available, Suction available and Patient being monitored Patient Re-evaluated:Patient Re-evaluated prior to inductionOxygen Delivery Method: Circle system utilized Intubation Type: IV induction and Combination inhalational/ intravenous induction Ventilation: Mask ventilation without difficulty Grade View: Grade II Tube type: Oral Tube size: 7.0 mm Number of attempts: 1 Airway Equipment and Method: Stylet Placement Confirmation: ETT inserted through vocal cords under direct vision,  positive ETCO2,  CO2 detector and breath sounds checked- equal and bilateral Secured at: 22 cm Tube secured with: Tape Dental Injury: Teeth and Oropharynx as per pre-operative assessment

## 2015-07-22 ENCOUNTER — Telehealth: Payer: Self-pay | Admitting: General Surgery

## 2015-07-22 LAB — SURGICAL PATHOLOGY

## 2015-07-22 NOTE — Telephone Encounter (Signed)
Called to report dizziness when up and about. Clears after a few minutes. No significant pain, no narcotic use. Tolerating diet well. Encouraged fluids. Had questions about the lesion seen on CT. Reviewed op note for her: no findings of a peri-gastric mass.  OK to remove tegaderm dressing tomorrow or Sunday. F/U as scheduled with Dr. Jamal Collin.

## 2015-07-25 ENCOUNTER — Telehealth: Payer: Self-pay

## 2015-07-25 NOTE — Telephone Encounter (Signed)
Patient called and had some concerns about bleeding from her belly button. She had a Laparoscopic Colectomy done on 07/21/15 and changed her dressing this morning. She reports 50 cent piece size area of fresh blood in the last 4 hours, no pain or discomfort. Patient instructed she may use cool compress to the area to stop any further bleeding and for comfort. She is aware to call back if the bleeding worsens or does not stop. Patient expresses understanding.

## 2015-07-28 ENCOUNTER — Encounter: Payer: Self-pay | Admitting: General Surgery

## 2015-07-28 ENCOUNTER — Encounter: Payer: Self-pay | Admitting: *Deleted

## 2015-07-28 ENCOUNTER — Ambulatory Visit (INDEPENDENT_AMBULATORY_CARE_PROVIDER_SITE_OTHER): Payer: BLUE CROSS/BLUE SHIELD | Admitting: General Surgery

## 2015-07-28 VITALS — BP 122/80 | HR 76 | Resp 12 | Ht 62.0 in | Wt 134.0 lb

## 2015-07-28 DIAGNOSIS — R591 Generalized enlarged lymph nodes: Secondary | ICD-10-CM

## 2015-07-28 DIAGNOSIS — K811 Chronic cholecystitis: Secondary | ICD-10-CM

## 2015-07-28 DIAGNOSIS — R1900 Intra-abdominal and pelvic swelling, mass and lump, unspecified site: Secondary | ICD-10-CM

## 2015-07-28 NOTE — Progress Notes (Signed)
This is a 35 year old female here today for her post op gallbladder removal done on 07/21/15. Patient is not having any pain associated with incisions. I have reviewed the history of present illness with the patient.  Port sites are clean and healing well. Abdomen is soft and non-tender. Lungs are clear.   At surgery inspection of the lesser curvature of the stomach did not reveal an obvious mass as noted on prior imaging.   Patient to be scheduled for a CT abdomen/pelvis with contrast in 2 months with an office visit to follow.   PCP:  Ronnald Ramp, This information has been scribed by Gaspar Cola CMA.

## 2015-08-09 ENCOUNTER — Encounter: Payer: Self-pay | Admitting: Family Medicine

## 2015-08-09 ENCOUNTER — Ambulatory Visit (INDEPENDENT_AMBULATORY_CARE_PROVIDER_SITE_OTHER): Payer: BLUE CROSS/BLUE SHIELD | Admitting: Family Medicine

## 2015-08-09 VITALS — BP 100/80 | HR 72 | Ht 62.0 in | Wt 134.0 lb

## 2015-08-09 DIAGNOSIS — S2341XA Sprain of ribs, initial encounter: Secondary | ICD-10-CM | POA: Diagnosis not present

## 2015-08-09 DIAGNOSIS — H109 Unspecified conjunctivitis: Secondary | ICD-10-CM

## 2015-08-09 DIAGNOSIS — J0100 Acute maxillary sinusitis, unspecified: Secondary | ICD-10-CM | POA: Diagnosis not present

## 2015-08-09 DIAGNOSIS — S29011A Strain of muscle and tendon of front wall of thorax, initial encounter: Secondary | ICD-10-CM

## 2015-08-09 MED ORDER — SULFACETAMIDE SODIUM 10 % OP SOLN: 1.0000 [drp] | OPHTHALMIC | Status: DC

## 2015-08-09 MED ORDER — AMOXICILLIN-POT CLAVULANATE 875-125 MG PO TABS: 1.0000 | ORAL_TABLET | Freq: Two times a day (BID) | ORAL | Status: DC

## 2015-08-09 MED ORDER — BENZONATATE 100 MG PO CAPS: 100.0000 mg | ORAL_CAPSULE | Freq: Two times a day (BID) | ORAL | Status: DC | PRN

## 2015-08-09 NOTE — Progress Notes (Signed)
Name: Krystal Hardy   MRN: [id number]    DOB: Oct 30, 1980   Date:08/09/2015       Progress Note  Subjective  Chief Complaint  Chief Complaint  Patient presents with  . Allergic Rhinitis     itchy eyes, sore throat, eyes swollen  . Flank Pain    hurting x 2-3 days- L) ribs     Flank Pain This is a new problem. The current episode started in the past 7 days. The problem occurs daily. The problem is unchanged. The quality of the pain is described as aching. The pain does not radiate. The pain is mild. Pertinent negatives include no abdominal pain, bladder incontinence, bowel incontinence, chest pain, dysuria, fever, headaches, numbness, tingling or weight loss. She has tried NSAIDs for the symptoms. The treatment provided no relief.    No problem-specific assessment & plan notes found for this encounter.   Past Medical History  Diagnosis Date  . Allergy   . Medical history non-contributory   . Anxiety     PT GETS VERY ANXIOUS WHEN SHE HAS ABDOMINAL PAIN-PT STATES SHE GETS SWEATY AND DIZZY-ONCE PAIN RESOLVES, SHE FEELS BETTER  . Asthma     -PT HAS NO INHALERS  . Joint pain   . Swelling of elbow joint   . Swelling of knee joint     Past Surgical History  Procedure Laterality Date  . No past surgeries    . Upper esophageal endoscopic ultrasound (eus) N/A 03/24/2015    Procedure: UPPER ESOPHAGEAL ENDOSCOPIC ULTRASOUND (EUS);  Surgeon: Jola Schmidt, MD;  Location: [facility] ENDOSCOPY;  Service: Endoscopy;  Laterality: N/A;  . Lasik    . Cholecystectomy N/A 07/21/2015    Procedure: LAPAROSCOPIC CHOLECYSTECTOMY with cholangiogram;  Surgeon: Christene Lye, MD;  Location: [facility] ORS;  Service: General;  Laterality: N/A;  . Laparoscopic removal of mesenteric mass N/A 07/21/2015    Procedure: exploration of lesser curvature of the stomach;  Surgeon: Christene Lye, MD;  Location: [facility] ORS;  Service: General;  Laterality: N/A;    Family History  Problem Relation Age of Onset  . Breast  cancer Maternal Aunt     Social History   Social History  . Marital Status: Married    Spouse Name: N/A  . Number of Children: N/A  . Years of Education: N/A   Occupational History  . Not on file.   Social History Main Topics  . Smoking status: Never Smoker   . Smokeless tobacco: Never Used  . Alcohol Use: No  . Drug Use: No  . Sexual Activity: Yes   Other Topics Concern  . Not on file   Social History Narrative    Allergies  Allergen Reactions  . Percocet [Oxycodone-Acetaminophen] Nausea And Vomiting     Review of Systems  Constitutional: Negative for fever, chills, weight loss and malaise/fatigue.  HENT: Negative for ear discharge, ear pain and sore throat.   Eyes: Negative for blurred vision.  Respiratory: Negative for cough, sputum production, shortness of breath and wheezing.   Cardiovascular: Negative for chest pain, palpitations and leg swelling.  Gastrointestinal: Negative for heartburn, nausea, abdominal pain, diarrhea, constipation, blood in stool, melena and bowel incontinence.  Genitourinary: Positive for flank pain. Negative for bladder incontinence, dysuria, urgency, frequency and hematuria.  Musculoskeletal: Negative for myalgias, back pain, joint pain and neck pain.  Skin: Negative for rash.  Neurological: Negative for dizziness, tingling, sensory change, focal weakness, numbness and headaches.  Endo/Heme/Allergies: Negative for environmental allergies and polydipsia. Does  not bruise/bleed easily.  Psychiatric/Behavioral: Negative for depression and suicidal ideas. The patient is not nervous/anxious and does not have insomnia.      Objective  Filed Vitals:   08/09/15 1415  BP: 100/80  Pulse: 72  Height: 5\' 2"  (1.575 m)  Weight: 134 lb (60.782 kg)    Physical Exam  Constitutional: She is well-developed, well-nourished, and in no distress. No distress.  HENT:  Head: Normocephalic and atraumatic.  Right Ear: External ear normal.  Left Ear:  External ear normal.  Nose: Mucosal edema present. Right sinus exhibits maxillary sinus tenderness. Left sinus exhibits maxillary sinus tenderness.  Mouth/Throat: Oropharynx is clear and moist.  Eyes: EOM are normal. Pupils are equal, round, and reactive to light. Right eye exhibits no discharge. Left eye exhibits no discharge. Right conjunctiva is injected. Left conjunctiva is injected.  Neck: Normal range of motion. Neck supple. No JVD present. No thyromegaly present.  Cardiovascular: Normal rate, regular rhythm, normal heart sounds and intact distal pulses.  Exam reveals no gallop and no friction rub.   No murmur heard. Pulmonary/Chest: Effort normal and breath sounds normal. No apnea. No respiratory distress. She exhibits tenderness.    Abdominal: Soft. Bowel sounds are normal. She exhibits no mass. There is no tenderness. There is no guarding.  Musculoskeletal: Normal range of motion. She exhibits no edema.  Lymphadenopathy:    She has no cervical adenopathy.  Neurological: She is alert. She has normal reflexes.  Skin: Skin is warm and dry. She is not diaphoretic.  Psychiatric: Mood and affect normal.      Assessment & Plan  Problem List Items Addressed This Visit    None    Visit Diagnoses    Acute maxillary sinusitis, recurrence not specified    -  Primary    Relevant Medications    amoxicillin-clavulanate (AUGMENTIN) 875-125 MG tablet    benzonatate (TESSALON) 100 MG capsule    Bilateral conjunctivitis        Relevant Medications    sulfacetamide (BLEPH-10) 10 % ophthalmic solution    Intercostal muscle strain, initial encounter        Relevant Medications    benzonatate (TESSALON) 100 MG capsule         Dr. Deanna Jones [facility]  08/09/2015

## 2015-08-30 ENCOUNTER — Ambulatory Visit (INDEPENDENT_AMBULATORY_CARE_PROVIDER_SITE_OTHER): Payer: BLUE CROSS/BLUE SHIELD | Admitting: General Surgery

## 2015-08-30 ENCOUNTER — Encounter: Payer: Self-pay | Admitting: General Surgery

## 2015-08-30 VITALS — BP 104/62 | HR 70 | Resp 12 | Ht 62.0 in | Wt 134.0 lb

## 2015-08-30 DIAGNOSIS — K811 Chronic cholecystitis: Secondary | ICD-10-CM

## 2015-08-30 NOTE — Patient Instructions (Addendum)
The patient is aware to call back for any questions or concerns. Patient may take tylenol, advil, or aleve as needed for pain or discomfort. Return in on 09/22/15

## 2015-08-30 NOTE — Progress Notes (Signed)
Follow up from a knot above cholecystectomy site. She states it is a little tender to touch and has been there since surgery. She feels it is same size. CT scan scheduled for 09-15-15.  I have reviewed the history of present illness with the patient.  Epigastric portsite has a 1cm firm area. Does not feel like a hernia. Likely scar tissue or a suture granuloma. Can be followed.  Patient to return in one months after CT scan as scheduled.   PCP:  Otilio Miu This information has been scribed by Karie Fetch RN, BSN,BC.

## 2015-09-15 ENCOUNTER — Ambulatory Visit: Payer: BLUE CROSS/BLUE SHIELD

## 2015-09-16 ENCOUNTER — Ambulatory Visit
Admission: RE | Admit: 2015-09-16 | Discharge: 2015-09-16 | Disposition: A | Discharge status: Home / Self Care | Payer: BLUE CROSS/BLUE SHIELD | Source: Ambulatory Visit | Attending: General Surgery | Admitting: General Surgery

## 2015-09-16 DIAGNOSIS — R591 Generalized enlarged lymph nodes: Secondary | ICD-10-CM | POA: Insufficient documentation

## 2015-09-16 DIAGNOSIS — R1900 Intra-abdominal and pelvic swelling, mass and lump, unspecified site: Secondary | ICD-10-CM | POA: Insufficient documentation

## 2015-09-16 MED ORDER — IOHEXOL 350 MG/ML SOLN
100.0000 mL | Freq: Once | INTRAVENOUS | Status: AC | PRN
  Administered 2015-09-16: 100 mL via INTRAVENOUS

## 2015-09-22 ENCOUNTER — Encounter: Payer: Self-pay | Admitting: General Surgery

## 2015-09-22 ENCOUNTER — Ambulatory Visit (INDEPENDENT_AMBULATORY_CARE_PROVIDER_SITE_OTHER): Payer: BLUE CROSS/BLUE SHIELD | Admitting: General Surgery

## 2015-09-22 VITALS — BP 100/60 | HR 94 | Resp 14 | Ht 62.0 in | Wt 134.0 lb

## 2015-09-22 DIAGNOSIS — R591 Generalized enlarged lymph nodes: Secondary | ICD-10-CM

## 2015-09-22 DIAGNOSIS — R1900 Intra-abdominal and pelvic swelling, mass and lump, unspecified site: Secondary | ICD-10-CM

## 2015-09-22 NOTE — Progress Notes (Signed)
Patient ID: Krystal Hardy, female   DOB: [birth year], 35 y.o.   MRN: [id number]  Chief Complaint  Patient presents with  . Follow-up    ct scan     HPI Krystal Hardy is a 35 y.o. female here today to discuss her follow up ct scan on 09/16/15. Patient states she is doing well. Prior CT scan revealed 2 cm mass near lesser curve of the stomach and mildly prominent lymph nodes.  Since cholecystectomy, no recurrence of abdominal pain.  I have reviewed the history of present illness with the patient.    HPI  Past Medical History  Diagnosis Date  . Allergy   . Medical history non-contributory   . Anxiety     PT GETS VERY ANXIOUS WHEN SHE HAS ABDOMINAL PAIN-PT STATES SHE GETS SWEATY AND DIZZY-ONCE PAIN RESOLVES, SHE FEELS BETTER  . Asthma     -PT HAS NO INHALERS  . Joint pain   . Swelling of elbow joint   . Swelling of knee joint     Past Surgical History  Procedure Laterality Date  . No past surgeries    . Upper esophageal endoscopic ultrasound (eus) N/A 03/24/2015    Procedure: UPPER ESOPHAGEAL ENDOSCOPIC ULTRASOUND (EUS);  Surgeon: Jola Schmidt, MD;  Location[facility] ENDOSCOPY;  Service: Endoscopy;  Laterality: N/A;  . Lasik    . Cholecystectomy N/A 07/21/2015    Procedure: LAPAROSCOPIC CHOLECYSTECTOMY with cholangiogram;  Surgeon: Christene Lye, MD;  Location: [facility] ORS;  Service: General;  Laterality: N/A;  . Laparoscopic removal of mesenteric mass N/A 07/21/2015    Procedure: exploration of lesser curvature of the stomach;  Surgeon: Christene Lye, MD;  Location: [facility] ORS;  Service: General;  Laterality: N/A;    Family History  Problem Relation Age of Onset  . Breast cancer Maternal Aunt     Social History Social History  Substance Use Topics  . Smoking status: Never Smoker   . Smokeless tobacco: Never Used  . Alcohol Use: No    Allergies  Allergen Reactions  . Percocet [Oxycodone-Acetaminophen] Nausea And Vomiting    Current Outpatient Prescriptions  Medication  Sig Dispense Refill  . cetirizine (ZYRTEC) 10 MG tablet Take 10 mg by mouth daily.    Marland Kitchen etodolac (LODINE) 400 MG tablet Take 1 tablet (400 mg total) by mouth 2 (two) times daily. 60 tablet 3  . fluticasone (FLONASE) 50 MCG/ACT nasal spray Place 1 spray into both nostrils daily.  0  . Ibuprofen (ADVIL) 200 MG CAPS Take 1 capsule by mouth as needed.    Marland Kitchen oxyCODONE-acetaminophen (PERCOCET) 5-325 MG tablet Take 2 tablets by mouth every 6 (six) hours as needed for moderate pain or severe pain. 30 tablet 0  . sulfacetamide (BLEPH-10) 10 % ophthalmic solution Place 1 drop into both eyes every 3 (three) hours. 15 mL 0   No current facility-administered medications for this visit.    Review of Systems Review of Systems  Constitutional: Negative.   Respiratory: Negative.   Cardiovascular: Negative.     Blood pressure 100/60, pulse 94, resp. rate 14, height 5\' 2"  ([id number] m), weight 134 lb (60.782 kg), last menstrual period 09/04/2015.  Physical Exam Physical Exam  Constitutional: She is oriented to person, place, and time. She appears well-developed and well-nourished.  Abdominal: Soft. Normal appearance and bowel sounds are normal. She exhibits no mass. There is no hepatomegaly. There is no tenderness.  Lymphadenopathy:    She has no cervical adenopathy.    She has no axillary  adenopathy.       Right: No inguinal adenopathy present.       Left: No inguinal adenopathy present.  No palpable lymphadenopathy.   Neurological: She is alert and oriented to person, place, and time.  Skin: Skin is warm and dry.    Data Reviewed Ct scan reviewed  The soft tissue mass near upper stomach lesser curve is  decreased in size from prior scan.  Few other mildly prominent nodes are stable    Assessment    Abdominal mass/lparagstric node, likely benign    Plan    Follow-up CT scan in 3 months for monitoring. If stable can follow at longer intervals. Patient agreeable to plan.   PCP:  Ronnald Ramp,  This  information has been scribed by Gaspar Cola CMA.        Kierah Goatley G 09/22/2015, 10:57 AM

## 2015-09-26 ENCOUNTER — Ambulatory Visit (INDEPENDENT_AMBULATORY_CARE_PROVIDER_SITE_OTHER): Payer: BLUE CROSS/BLUE SHIELD | Admitting: Family Medicine

## 2015-09-26 ENCOUNTER — Encounter: Payer: Self-pay | Admitting: Family Medicine

## 2015-09-26 VITALS — BP 100/60 | HR 80 | Ht 62.0 in | Wt 132.0 lb

## 2015-09-26 DIAGNOSIS — Z0000 Encounter for general adult medical examination without abnormal findings: Secondary | ICD-10-CM

## 2015-09-26 DIAGNOSIS — E785 Hyperlipidemia, unspecified: Secondary | ICD-10-CM | POA: Diagnosis not present

## 2015-09-26 LAB — POCT URINALYSIS DIPSTICK
Bilirubin, UA: NEGATIVE
Blood, UA: NEGATIVE
Glucose, UA: NEGATIVE
Ketones, UA: NEGATIVE
Leukocytes, UA: NEGATIVE
Nitrite, UA: NEGATIVE
Protein, UA: NEGATIVE
SPEC GRAV UA: 1.01
UROBILINOGEN UA: 0.2
pH, UA: 5

## 2015-09-26 NOTE — Progress Notes (Signed)
Name: Krystal Hardy   MRN: [id number]    DOB: [birth year]   Date:09/26/2015       Progress Note  Subjective  Chief Complaint  Chief Complaint  Patient presents with  . Annual Exam    no pap- "just had alot of scans from cancer dr"    HPI Comments: Patient presents for annual physical exam.   No problem-specific assessment & plan notes found for this encounter.   Past Medical History  Diagnosis Date  . Allergy   . Medical history non-contributory   . Anxiety     PT GETS VERY ANXIOUS WHEN SHE HAS ABDOMINAL PAIN-PT STATES SHE GETS SWEATY AND DIZZY-ONCE PAIN RESOLVES, SHE FEELS BETTER  . Asthma     -PT HAS NO INHALERS  . Joint pain   . Swelling of elbow joint   . Swelling of knee joint     Past Surgical History  Procedure Laterality Date  . No past surgeries    . Upper esophageal endoscopic ultrasound (eus) N/A 03/24/2015    Procedure: UPPER ESOPHAGEAL ENDOSCOPIC ULTRASOUND (EUS);  Surgeon: Jola Schmidt, MD;  Location: [facility] ENDOSCOPY;  Service: Endoscopy;  Laterality: N/A;  . Lasik    . Cholecystectomy N/A 07/21/2015    Procedure: LAPAROSCOPIC CHOLECYSTECTOMY with cholangiogram;  Surgeon: Christene Lye, MD;  Location: [facility] ORS;  Service: General;  Laterality: N/A;  . Laparoscopic removal of mesenteric mass N/A 07/21/2015    Procedure: exploration of lesser curvature of the stomach;  Surgeon: Christene Lye, MD;  Location: [facility] ORS;  Service: General;  Laterality: N/A;    Family History  Problem Relation Age of Onset  . Breast cancer Maternal Aunt     Social History   Social History  . Marital Status: Married    Spouse Name: N/A  . Number of Children: N/A  . Years of Education: N/A   Occupational History  . Not on file.   Social History Main Topics  . Smoking status: Never Smoker   . Smokeless tobacco: Never Used  . Alcohol Use: No  . Drug Use: No  . Sexual Activity: Yes   Other Topics Concern  . Not on file   Social History Narrative     Allergies  Allergen Reactions  . Percocet [Oxycodone-Acetaminophen] Nausea And Vomiting     Review of Systems  Constitutional: Negative for fever, chills, weight loss and malaise/fatigue.  HENT: Negative for ear discharge, ear pain and sore throat.   Eyes: Negative for blurred vision.  Respiratory: Negative for cough, sputum production, shortness of breath and wheezing.   Cardiovascular: Negative for chest pain, palpitations and leg swelling.  Gastrointestinal: Positive for abdominal pain. Negative for heartburn, nausea, diarrhea, constipation, blood in stool and melena.  Genitourinary: Negative for dysuria, urgency, frequency and hematuria.  Musculoskeletal: Negative for myalgias, back pain, joint pain and neck pain.  Skin: Negative for rash.  Neurological: Negative for dizziness, tingling, sensory change, focal weakness and headaches.  Endo/Heme/Allergies: Negative for environmental allergies and polydipsia. Does not bruise/bleed easily.  Psychiatric/Behavioral: Negative for depression and suicidal ideas. The patient is not nervous/anxious and does not have insomnia.      Objective  Filed Vitals:   09/26/15 0831  BP: 100/60  Pulse: 80  Height: 5\' 2"  (1.575 m)  Weight: 132 lb (59.875 kg)    Physical Exam  Constitutional: She is well-developed, well-nourished, and in no distress. No distress.  HENT:  Head: Normocephalic and atraumatic.  Right Ear: Tympanic membrane, external ear and ear  canal normal.  Left Ear: Tympanic membrane, external ear and ear canal normal.  Nose: Nose normal.  Mouth/Throat: Uvula is midline, oropharynx is clear and moist and mucous membranes are normal.  Eyes: Conjunctivae and EOM are normal. Pupils are equal, round, and reactive to light. Right eye exhibits no discharge. Left eye exhibits no discharge.  Neck: Normal range of motion. Neck supple. No JVD present. No thyromegaly present.  Cardiovascular: Normal rate, regular rhythm, S1 normal, S2  normal, normal heart sounds and intact distal pulses.  Exam reveals no gallop and no friction rub.   No murmur heard. Pulmonary/Chest: Effort normal and breath sounds normal. No respiratory distress. She has no wheezes. She has no rales. She exhibits no tenderness.  Abdominal: Soft. Bowel sounds are normal. She exhibits no mass. There is no tenderness. There is no guarding.  Genitourinary: Rectum normal.  Musculoskeletal: Normal range of motion. She exhibits no edema.  Lymphadenopathy:    She has no cervical adenopathy.  Neurological: She is alert. She has normal reflexes. No cranial nerve deficit. She exhibits normal muscle tone. Coordination normal.  Skin: Skin is warm and dry. No rash noted. She is not diaphoretic. No erythema. No pallor.  Psychiatric: Mood and affect normal.  Nursing note and vitals reviewed.     Assessment & Plan  Problem List Items Addressed This Visit    None    Visit Diagnoses    Annual physical exam    -  Primary    Relevant Orders    POCT urinalysis dipstick (Completed)    Hyperlipidemia        Relevant Orders    Lipid Profile         Dr. Otilio Miu [facility]  09/26/2015

## 2015-09-27 LAB — LIPID PANEL
CHOLESTEROL TOTAL: 168 mg/dL (ref 100–199)
Chol/HDL Ratio: 3.4 ratio units (ref 0.0–4.4)
HDL: 50 mg/dL (ref >39)
LDL Calculated: 102 mg/dL — ABNORMAL HIGH (ref 0–99)
TRIGLYCERIDES: 79 mg/dL (ref 0–149)
VLDL Cholesterol Cal: 16 mg/dL (ref 5–40)

## 2015-11-02 ENCOUNTER — Encounter: Payer: Self-pay | Admitting: General Surgery

## 2015-11-02 ENCOUNTER — Ambulatory Visit (INDEPENDENT_AMBULATORY_CARE_PROVIDER_SITE_OTHER): Payer: BLUE CROSS/BLUE SHIELD | Admitting: General Surgery

## 2015-11-02 VITALS — BP 120/80 | HR 82 | Resp 12 | Ht 60.0 in | Wt 135.0 lb

## 2015-11-02 DIAGNOSIS — R1900 Intra-abdominal and pelvic swelling, mass and lump, unspecified site: Secondary | ICD-10-CM

## 2015-11-02 DIAGNOSIS — M940 Chondrocostal junction syndrome [Tietze]: Secondary | ICD-10-CM

## 2015-11-02 NOTE — Patient Instructions (Signed)
The patient is aware to call back for any questions or concerns.  

## 2015-11-02 NOTE — Progress Notes (Signed)
Patient ID: Krystal Hardy, female   DOB: July 10, 1980, 35 y.o.   MRN: [id number]  Chief Complaint  Patient presents with  . Follow-up    pain    HPI Krystal Hardy is a 35 y.o. female Follow up from a knot she felts under her left ribs. She states the area is very sore and tender to touch.  I have reviewed the history of present illness with the patient.  HPI  Past Medical History:  Diagnosis Date  . Allergy   . Anxiety    PT GETS VERY ANXIOUS WHEN SHE HAS ABDOMINAL PAIN-PT STATES SHE GETS SWEATY AND DIZZY-ONCE PAIN RESOLVES, SHE FEELS BETTER  . Asthma    -PT HAS NO INHALERS  . Joint pain   . Medical history non-contributory   . Swelling of elbow joint   . Swelling of knee joint     Past Surgical History:  Procedure Laterality Date  . CHOLECYSTECTOMY N/A 07/21/2015   Procedure: LAPAROSCOPIC CHOLECYSTECTOMY with cholangiogram;  Surgeon: Christene Lye, MD;  Location: [facility] ORS;  Service: General;  Laterality: N/A;  . LAPAROSCOPIC REMOVAL OF MESENTERIC MASS N/A 07/21/2015   Procedure: exploration of lesser curvature of the stomach;  Surgeon: Christene Lye, MD;  Location: [facility] ORS;  Service: General;  Laterality: N/A;  . LASIK    . NO PAST SURGERIES    . UPPER ESOPHAGEAL ENDOSCOPIC ULTRASOUND (EUS) N/A 03/24/2015   Procedure: UPPER ESOPHAGEAL ENDOSCOPIC ULTRASOUND (EUS);  Surgeon: Jola Schmidt, MD;  Location: [facility] ENDOSCOPY;  Service: Endoscopy;  Laterality: N/A;    Family History  Problem Relation Age of Onset  . Breast cancer Maternal Aunt     Social History Social History  Substance Use Topics  . Smoking status: Never Smoker  . Smokeless tobacco: Never Used  . Alcohol use No    Allergies  Allergen Reactions  . Percocet [Oxycodone-Acetaminophen] Nausea And Vomiting    Current Outpatient Prescriptions  Medication Sig Dispense Refill  . cetirizine (ZYRTEC) 10 MG tablet Take 10 mg by mouth daily.    Marland Kitchen etodolac (LODINE) 400 MG tablet Take 1 tablet (400 mg total) by  mouth 2 (two) times daily. 60 tablet 3  . fluticasone (FLONASE) 50 MCG/ACT nasal spray Place 1 spray into both nostrils daily.  0  . Ibuprofen (ADVIL) 200 MG CAPS Take 1 capsule by mouth as needed.    Marland Kitchen oxyCODONE-acetaminophen (PERCOCET) 5-325 MG tablet Take 2 tablets by mouth every 6 (six) hours as needed for moderate pain or severe pain. 30 tablet 0  . sulfacetamide (BLEPH-10) 10 % ophthalmic solution Place 1 drop into both eyes every 3 (three) hours. 15 mL 0   No current facility-administered medications for this visit.     Review of Systems Review of Systems  Constitutional: Negative.   Respiratory: Negative.   Cardiovascular: Negative.     Blood pressure 120/80, pulse 82, resp. rate 12, height 5' (1.524 m), weight 135 lb (61.2 kg).  Physical Exam Physical Exam  Constitutional: She is oriented to person, place, and time. She appears well-developed and well-nourished.  Pulmonary/Chest: Effort normal. She exhibits tenderness. She exhibits no mass, no laceration, no deformity and no swelling.    Abdominal: Soft. Normal appearance.  Neurological: She is alert and oriented to person, place, and time.  Skin: Skin is warm and dry.  Psychiatric: Her behavior is normal.    Data Reviewed Prior notes reviewed  Assessment    Possible costochondritis. Recent CT scan for incisional tenderness s/p lap cholecystectomy  showed normal anatomy.    Plan    Patient encouraged to use ice packs on the area occasionally.     Follow up appointment to be announced.  This information has been scribed by Gaspar Cola CMA.  Zakye Baby G 11/04/2015, 10:56 AM

## 2015-11-03 ENCOUNTER — Encounter: Payer: Self-pay | Admitting: *Deleted

## 2015-11-03 ENCOUNTER — Other Ambulatory Visit: Payer: Self-pay | Admitting: General Surgery

## 2015-11-03 DIAGNOSIS — R591 Generalized enlarged lymph nodes: Secondary | ICD-10-CM

## 2015-11-04 ENCOUNTER — Encounter: Payer: Self-pay | Admitting: General Surgery

## 2015-11-07 DIAGNOSIS — R1900 Intra-abdominal and pelvic swelling, mass and lump, unspecified site: Secondary | ICD-10-CM

## 2015-11-10 ENCOUNTER — Encounter: Payer: Self-pay | Admitting: General Surgery

## 2015-11-10 ENCOUNTER — Ambulatory Visit (INDEPENDENT_AMBULATORY_CARE_PROVIDER_SITE_OTHER): Payer: BLUE CROSS/BLUE SHIELD | Admitting: General Surgery

## 2015-11-10 ENCOUNTER — Telehealth: Payer: Self-pay | Admitting: *Deleted

## 2015-11-10 VITALS — BP 120/74 | HR 74 | Resp 12 | Ht 62.0 in | Wt 135.0 lb

## 2015-11-10 DIAGNOSIS — R1900 Intra-abdominal and pelvic swelling, mass and lump, unspecified site: Secondary | ICD-10-CM | POA: Diagnosis not present

## 2015-11-10 NOTE — Patient Instructions (Addendum)
The patient is aware to call back for any questions or concerns.  The patient is scheduled for a CT abdomen/pelvis with contrast on 11/28/15 at 9 am. She will arrive at 8:45 am. She will have nothing to eat or drink for 4 hours prior. She will pick up a prep kit for this. The patient is aware of date, time, and instructions.

## 2015-11-10 NOTE — Telephone Encounter (Signed)
Patient called and stated that the last time  she was in the office Dr. Jamal Collin told her that he would consult with another provider about what's going on with her and contact her back. She has not heard anything back. Patient is wondering if he reached out to another provider. Pt is requesting a call back. Her number is [phone]. Thank s

## 2015-11-10 NOTE — Progress Notes (Signed)
Patient ID: Krystal Hardy, female   DOB: 1980-08-19, 35 y.o.   MRN: [id number]  Chief Complaint  Patient presents with  . Follow-up    HPI Krystal Hardy is a 35 y.o. female.  Here today for follow up on rib pain. She notes that the pain has not changed since her last visit. She has not tried any medications to alleviate the pain because she states that it is not intolerable. Her last CT scan was done on 09-16-15. Prior CT scan revealed 2 cm mass near lesser curve of the stomach and mildly prominent lymph nodes, but no abnormality near the costal margin.  I have reviewed the history of present illness with the patient.  HPI  Past Medical History:  Diagnosis Date  . Allergy   . Anxiety    PT GETS VERY ANXIOUS WHEN SHE HAS ABDOMINAL PAIN-PT STATES SHE GETS SWEATY AND DIZZY-ONCE PAIN RESOLVES, SHE FEELS BETTER  . Asthma    -PT HAS NO INHALERS  . Joint pain   . Medical history non-contributory   . Swelling of elbow joint   . Swelling of knee joint     Past Surgical History:  Procedure Laterality Date  . CHOLECYSTECTOMY N/A 07/21/2015   Procedure: LAPAROSCOPIC CHOLECYSTECTOMY with cholangiogram;  Surgeon: Christene Lye, MD;  Location: [facility]S;  Service: General;  Laterality: N/A;  . LAPAROSCOPIC REMOVAL OF MESENTERIC MASS N/A 07/21/2015   Procedure: exploration of lesser curvature of the stomach;  Surgeon: Christene Lye, MD;  Location: [facility];  Service: General;  Laterality: N/A;  . LASIK    . NO PAST SURGERIES    . UPPER ESOPHAGEAL ENDOSCOPIC ULTRASOUND (EUS) N/A 03/24/2015   Procedure: UPPER ESOPHAGEAL ENDOSCOPIC ULTRASOUND (EUS);  Surgeon: Jola Schmidt, MD;  Location: [facility] ENDOSCOPY;  Service: Endoscopy;  Laterality: N/A;    Family History  Problem Relation Age of Onset  . Breast cancer Maternal Aunt     Social History Social History  Substance Use Topics  . Smoking status: Never Smoker  . Smokeless tobacco: Never Used  . Alcohol use No    Allergies  Allergen  Reactions  . Percocet [Oxycodone-Acetaminophen] Nausea And Vomiting    Current Outpatient Prescriptions  Medication Sig Dispense Refill  . cetirizine (ZYRTEC) 10 MG tablet Take 10 mg by mouth daily.    Marland Kitchen etodolac (LODINE) 400 MG tablet Take 1 tablet (400 mg total) by mouth 2 (two) times daily. 60 tablet 3  . fluticasone (FLONASE) 50 MCG/ACT nasal spray Place 1 spray into both nostrils daily.  0  . Ibuprofen (ADVIL) 200 MG CAPS Take 1 capsule by mouth as needed.    Marland Kitchen oxyCODONE-acetaminophen (PERCOCET) 5-325 MG tablet Take 2 tablets by mouth every 6 (six) hours as needed for moderate pain or severe pain. 30 tablet 0  . sulfacetamide (BLEPH-10) 10 % ophthalmic solution Place 1 drop into both eyes every 3 (three) hours. 15 mL 0   No current facility-administered medications for this visit.     Review of Systems Review of Systems  Constitutional: Negative.   Respiratory: Negative.   Cardiovascular: Negative.     Blood pressure 120/74, pulse 74, resp. rate 12, height 5' 2"  (1.575 m), weight 135 lb (61.2 kg).  Physical Exam Physical Exam  Constitutional: She appears well-developed and well-nourished.  Pulmonary/Chest:    Abdominal: She exhibits no distension. There is no tenderness.    Data Reviewed  Prior notes, CT scan  Assessment    Possibly costochondritis. Prior CT scan showed  no costal or abdominal wall etiology for her discomfort.  Mass near the lesser curvature of the stomach - biopsy done several months ago was inconclusive. Follow up CT done in June showed that the mass had slightly decreased in size. It does not appear that this mass is causing her pain. Her previous abdominal pain was in line with biliary colic and has resolved following cholecystectomy.   Plan    Patient was advised to take 2 tablets of Aleve, twice per day until her next visit here. Based on the persistence of the patient's symptoms, a repeat CT scan will be done    The patient is scheduled for  a CT abdomen/pelvis with contrast on 11/28/15 at 9 am. She will arrive at 8:45 am. She will have nothing to eat or drink for 4 hours prior. She will pick up a prep kit for this. The patient is aware of date, time, and instructions.   This information has been scribed by Karie Fetch RN, BSN,BC.    Nagi Furio G 11/10/2015, 2:58 PM

## 2015-11-10 NOTE — Telephone Encounter (Signed)
Dr Jamal Collin would like for her to have a follow up appointment for re- evaluation.

## 2015-11-28 ENCOUNTER — Ambulatory Visit
Admission: RE | Admit: 2015-11-28 | Discharge: 2015-11-28 | Disposition: A | Discharge status: Home / Self Care | Payer: BLUE CROSS/BLUE SHIELD | Source: Ambulatory Visit | Attending: General Surgery | Admitting: General Surgery

## 2015-11-28 DIAGNOSIS — R591 Generalized enlarged lymph nodes: Secondary | ICD-10-CM | POA: Diagnosis present

## 2015-11-28 MED ORDER — IOPAMIDOL (ISOVUE-300) INJECTION 61%
100.0000 mL | Freq: Once | INTRAVENOUS | Status: AC | PRN
  Administered 2015-11-28: 100 mL via INTRAVENOUS

## 2015-12-06 ENCOUNTER — Encounter: Payer: Self-pay | Admitting: General Surgery

## 2015-12-06 ENCOUNTER — Ambulatory Visit (INDEPENDENT_AMBULATORY_CARE_PROVIDER_SITE_OTHER): Payer: BLUE CROSS/BLUE SHIELD | Admitting: General Surgery

## 2015-12-06 VITALS — BP 128/70 | HR 74 | Resp 12 | Ht 62.0 in | Wt 137.0 lb

## 2015-12-06 DIAGNOSIS — R1900 Intra-abdominal and pelvic swelling, mass and lump, unspecified site: Secondary | ICD-10-CM

## 2015-12-06 NOTE — Patient Instructions (Addendum)
Patient to return in six months ct scan and office visit.

## 2015-12-06 NOTE — Progress Notes (Signed)
Patient ID: Krystal Hardy, female   DOB: 09-18-1980, 35 y.o.   MRN: [id number]  Chief Complaint  Patient presents with  . Follow-up    CT scan    HPI Krystal Hardy is a 35 y.o. female.  Here today for follow up CT scan done 11-09-15. She has a perigastric 2.3cm mass and few mildly prominent nodes. Prior EUS biopsy was inconclusive for nature of the mass. Pt still c/o focal pain over the left costal margin, not severe, better with use of Aleve. I have reviewed the history of present illness with the patient.  HPI  Past Medical History:  Diagnosis Date  . Allergy   . Anxiety    PT GETS VERY ANXIOUS WHEN SHE HAS ABDOMINAL PAIN-PT STATES SHE GETS SWEATY AND DIZZY-ONCE PAIN RESOLVES, SHE FEELS BETTER  . Asthma    -PT HAS NO INHALERS  . Joint pain   . Medical history non-contributory   . Swelling of elbow joint   . Swelling of knee joint     Past Surgical History:  Procedure Laterality Date  . CHOLECYSTECTOMY N/A 07/21/2015   Procedure: LAPAROSCOPIC CHOLECYSTECTOMY with cholangiogram;  Surgeon: Christene Lye, MD;  Location: [facility];  Service: General;  Laterality: N/A;  . LAPAROSCOPIC REMOVAL OF MESENTERIC MASS N/A 07/21/2015   Procedure: exploration of lesser curvature of the stomach;  Surgeon: Christene Lye, MD;  Location: [facility];  Service: General;  Laterality: N/A;  . LASIK    . NO PAST SURGERIES    . UPPER ESOPHAGEAL ENDOSCOPIC ULTRASOUND (EUS) N/A 03/24/2015   Procedure: UPPER ESOPHAGEAL ENDOSCOPIC ULTRASOUND (EUS);  Surgeon: Jola Schmidt, MD;  Location: [facility] ENDOSCOPY;  Service: Endoscopy;  Laterality: N/A;    Family History  Problem Relation Age of Onset  . Breast cancer Maternal Aunt     Social History Social History  Substance Use Topics  . Smoking status: Never Smoker  . Smokeless tobacco: Never Used  . Alcohol use No    Allergies  Allergen Reactions  . Percocet [Oxycodone-Acetaminophen] Nausea And Vomiting    Current Outpatient Prescriptions   Medication Sig Dispense Refill  . cetirizine (ZYRTEC) 10 MG tablet Take 10 mg by mouth daily.    Marland Kitchen etodolac (LODINE) 400 MG tablet Take 1 tablet (400 mg total) by mouth 2 (two) times daily. 60 tablet 3  . fluticasone (FLONASE) 50 MCG/ACT nasal spray Place 1 spray into both nostrils daily.  0  . Ibuprofen (ADVIL) 200 MG CAPS Take 1 capsule by mouth as needed.    Marland Kitchen oxyCODONE-acetaminophen (PERCOCET) 5-325 MG tablet Take 2 tablets by mouth every 6 (six) hours as needed for moderate pain or severe pain. 30 tablet 0  . sulfacetamide (BLEPH-10) 10 % ophthalmic solution Place 1 drop into both eyes every 3 (three) hours. 15 mL 0   No current facility-administered medications for this visit.     Review of Systems Review of Systems  Constitutional: Negative.   Respiratory: Negative.   Cardiovascular: Negative.     Blood pressure 128/70, pulse 74, resp. rate 12, height 5\' 2"  (1.575 m), weight 137 lb (62.1 kg).  Physical Exam Physical Exam  Constitutional: She is oriented to person, place, and time. She appears well-developed and well-nourished.  Eyes: Conjunctivae are normal. No scleral icterus.  Neck: Neck supple.  Cardiovascular: Normal rate and regular rhythm.   Pulmonary/Chest: She exhibits bony tenderness.    Abdominal: Soft. Bowel sounds are normal. She exhibits no mass. There is no hepatomegaly. There is no tenderness.  Lymphadenopathy:    She has no cervical adenopathy.    She has no axillary adenopathy.  Neurological: She is alert and oriented to person, place, and time.  Skin: Skin is warm and dry.    Data Reviewed CT scan reviewed. No change in perigastric mass or the lymph nodes. Assessment    Perigastric mass- no appreciable change over last 9-10 mos. Likely a benign finding Costochondritis- should resolve in time.  Pt advised fully.  Plan    Patient to return in six months with CT scan   This information has been scribed by Karie Fetch RN,  BSN,BC.   Shyniece Scripter G 12/06/2015, 9:24 AM

## 2016-01-04 ENCOUNTER — Ambulatory Visit: Payer: BLUE CROSS/BLUE SHIELD

## 2016-01-04 ENCOUNTER — Ambulatory Visit: Payer: BLUE CROSS/BLUE SHIELD | Admitting: General Surgery

## 2016-01-09 ENCOUNTER — Ambulatory Visit
Admission: RE | Admit: 2016-01-09 | Discharge: 2016-01-09 | Disposition: A | Discharge status: Home / Self Care | Payer: Disability Insurance | Source: Ambulatory Visit | Attending: Obstetrics and Gynecology | Admitting: Obstetrics and Gynecology

## 2016-01-09 ENCOUNTER — Other Ambulatory Visit: Payer: Self-pay | Admitting: Obstetrics and Gynecology

## 2016-01-09 DIAGNOSIS — M1990 Unspecified osteoarthritis, unspecified site: Secondary | ICD-10-CM | POA: Diagnosis present

## 2016-01-11 ENCOUNTER — Ambulatory Visit (INDEPENDENT_AMBULATORY_CARE_PROVIDER_SITE_OTHER): Payer: BLUE CROSS/BLUE SHIELD | Admitting: Family Medicine

## 2016-01-11 ENCOUNTER — Encounter: Payer: Self-pay | Admitting: Family Medicine

## 2016-01-11 VITALS — BP 110/70 | HR 60 | Temp 97.9°F | Ht 62.0 in | Wt 135.0 lb

## 2016-01-11 DIAGNOSIS — J0100 Acute maxillary sinusitis, unspecified: Secondary | ICD-10-CM

## 2016-01-11 MED ORDER — AMOXICILLIN 500 MG PO CAPS: 500.0000 mg | ORAL_CAPSULE | Freq: Three times a day (TID) | ORAL | 0 refills | Status: DC

## 2016-01-11 NOTE — Progress Notes (Signed)
Name: Krystal Hardy   MRN: [id number]    DOB: 1980/12/08   Date:01/11/2016       Progress Note  Subjective  Chief Complaint  Chief Complaint  Patient presents with  . Sinusitis    cough, cong and dizziness    Sinusitis  This is a new problem. The current episode started in the past 7 days. The problem has been gradually worsening since onset. There has been no fever. Her pain is at a severity of 0/10. Associated symptoms include congestion and sinus pressure. Pertinent negatives include no chills, coughing, diaphoresis, ear pain, headaches, hoarse voice, neck pain, shortness of breath, sneezing, sore throat or swollen glands. Past treatments include nothing. The treatment provided mild relief.    No problem-specific Assessment & Plan notes found for this encounter.   Past Medical History:  Diagnosis Date  . Allergy   . Anxiety    PT GETS VERY ANXIOUS WHEN SHE HAS ABDOMINAL PAIN-PT STATES SHE GETS SWEATY AND DIZZY-ONCE PAIN RESOLVES, SHE FEELS BETTER  . Asthma    -PT HAS NO INHALERS  . Joint pain   . Medical history non-contributory   . Swelling of elbow joint   . Swelling of knee joint     Past Surgical History:  Procedure Laterality Date  . CHOLECYSTECTOMY N/A 07/21/2015   Procedure: LAPAROSCOPIC CHOLECYSTECTOMY with cholangiogram;  Surgeon: Christene Lye, MD;  Location: [facility];  Service: General;  Laterality: N/A;  . LAPAROSCOPIC REMOVAL OF MESENTERIC MASS N/A 07/21/2015   Procedure: exploration of lesser curvature of the stomach;  Surgeon: Christene Lye, MD;  Location: [facility];  Service: General;  Laterality: N/A;  . LASIK    . NO PAST SURGERIES    . UPPER ESOPHAGEAL ENDOSCOPIC ULTRASOUND (EUS) N/A 03/24/2015   Procedure: UPPER ESOPHAGEAL ENDOSCOPIC ULTRASOUND (EUS);  Surgeon: Jola Schmidt, MD;  Location: [facility] ENDOSCOPY;  Service: Endoscopy;  Laterality: N/A;    Family History  Problem Relation Age of Onset  . Breast cancer Maternal Aunt     Social  History   Social History  . Marital status: Married    Spouse name: N/A  . Number of children: N/A  . Years of education: N/A   Occupational History  . Not on file.   Social History Main Topics  . Smoking status: Never Smoker  . Smokeless tobacco: Never Used  . Alcohol use No  . Drug use: No  . Sexual activity: Yes   Other Topics Concern  . Not on file   Social History Narrative  . No narrative on file    Allergies  Allergen Reactions  . Percocet [Oxycodone-Acetaminophen] Nausea And Vomiting     Review of Systems  Constitutional: Negative for chills, diaphoresis, fever, malaise/fatigue and weight loss.  HENT: Positive for congestion and sinus pressure. Negative for ear discharge, ear pain, hoarse voice, sneezing and sore throat.   Eyes: Negative for blurred vision.  Respiratory: Negative for cough, sputum production, shortness of breath and wheezing.   Cardiovascular: Negative for chest pain, palpitations and leg swelling.  Gastrointestinal: Negative for abdominal pain, blood in stool, constipation, diarrhea, heartburn, melena and nausea.  Genitourinary: Negative for dysuria, frequency, hematuria and urgency.  Musculoskeletal: Negative for back pain, joint pain, myalgias and neck pain.  Skin: Negative for rash.  Neurological: Negative for dizziness, tingling, sensory change, focal weakness and headaches.  Endo/Heme/Allergies: Negative for environmental allergies and polydipsia. Does not bruise/bleed easily.  Psychiatric/Behavioral: Negative for depression and suicidal ideas. The patient is not  nervous/anxious and does not have insomnia.      Objective  Vitals:   01/11/16 1106  BP: 110/70  Pulse: 60  Temp: 97.9 F (36.6 C)  TempSrc: Oral  Weight: 135 lb (61.2 kg)  Height: 5\' 2"  (1.575 m)    Physical Exam  Constitutional: She is well-developed, well-nourished, and in no distress. No distress.  HENT:  Head: Normocephalic and atraumatic.  Right Ear:  External ear normal.  Left Ear: External ear normal.  Nose: Right sinus exhibits maxillary sinus tenderness. Left sinus exhibits maxillary sinus tenderness.  Mouth/Throat: Oropharynx is clear and moist.  Eyes: Conjunctivae and EOM are normal. Pupils are equal, round, and reactive to light. Right eye exhibits no discharge. Left eye exhibits no discharge.  Neck: Normal range of motion. Neck supple. No JVD present. No thyromegaly present.  Cardiovascular: Normal rate, regular rhythm, normal heart sounds and intact distal pulses.  Exam reveals no gallop and no friction rub.   No murmur heard. Pulmonary/Chest: Effort normal and breath sounds normal. She has no wheezes. She has no rales.  Abdominal: Soft. Bowel sounds are normal. She exhibits no mass. There is no tenderness. There is no guarding.  Musculoskeletal: Normal range of motion. She exhibits no edema.  Lymphadenopathy:    She has no cervical adenopathy.  Neurological: She is alert. She has normal reflexes.  Skin: Skin is warm and dry. She is not diaphoretic.  Psychiatric: Mood and affect normal.  Nursing note and vitals reviewed.     Assessment & Plan  Problem List Items Addressed This Visit    None    Visit Diagnoses    Acute non-recurrent maxillary sinusitis    -  Primary   Relevant Medications   amoxicillin (AMOXIL) 500 MG capsule        Dr. Mac[facility]  01/11/16

## 2016-01-12 ENCOUNTER — Ambulatory Visit: Payer: BLUE CROSS/BLUE SHIELD | Admitting: General Surgery

## 2016-03-12 ENCOUNTER — Ambulatory Visit (INDEPENDENT_AMBULATORY_CARE_PROVIDER_SITE_OTHER): Payer: BLUE CROSS/BLUE SHIELD | Admitting: Family Medicine

## 2016-03-12 VITALS — BP 116/88 | HR 74 | Temp 98.1°F | Ht 62.0 in | Wt 136.0 lb

## 2016-03-12 DIAGNOSIS — R1013 Epigastric pain: Secondary | ICD-10-CM | POA: Diagnosis not present

## 2016-03-12 DIAGNOSIS — J0100 Acute maxillary sinusitis, unspecified: Secondary | ICD-10-CM

## 2016-03-12 DIAGNOSIS — S46812A Strain of other muscles, fascia and tendons at shoulder and upper arm level, left arm, initial encounter: Secondary | ICD-10-CM

## 2016-03-12 DIAGNOSIS — R5383 Other fatigue: Secondary | ICD-10-CM

## 2016-03-12 MED ORDER — AZITHROMYCIN 250 MG PO TABS: ORAL_TABLET | ORAL | 0 refills | Status: DC

## 2016-03-12 NOTE — Progress Notes (Signed)
Name: Krystal Hardy   MRN: [id number]    DOB: 10/15/1980   Date:03/12/2016       Progress Note  Subjective  Chief Complaint  Chief Complaint  Patient presents with  . Sinus Problem    Pt stated having sinus problem, ear rt is painful 1 week.    Patient experiencing fatigue.   Sinus Problem  This is a new problem. The current episode started in the past 7 days. The problem has been gradually worsening since onset. There has been no fever. Associated symptoms include congestion, coughing, ear pain and neck pain. Pertinent negatives include no chills, diaphoresis, headaches, hoarse voice, shortness of breath, sinus pressure, sneezing, sore throat or swollen glands. Past treatments include nothing. The treatment provided no relief.  Abdominal Pain  This is a recurrent problem. The current episode started more than 1 year ago. The problem occurs daily. The problem has been waxing and waning. The pain is located in the epigastric region. The pain is moderate. The quality of the pain is aching. The abdominal pain does not radiate. Pertinent negatives include no constipation, diarrhea, dysuria, fever, frequency, headaches, hematuria, melena, myalgias, nausea or weight loss. Nothing aggravates the pain. The pain is relieved by nothing.  Neck Pain   This is a new problem. The current episode started 1 to 4 weeks ago. The problem occurs intermittently. The problem has been waxing and waning. The pain is associated with nothing. The quality of the pain is described as aching. Exacerbated by: work at [employer]. Pertinent negatives include no chest pain, fever, headaches, tingling or weight loss.    No problem-specific Assessment & Plan notes found for this encounter.   Past Medical History:  Diagnosis Date  . Allergy   . Anxiety    PT GETS VERY ANXIOUS WHEN SHE HAS ABDOMINAL PAIN-PT STATES SHE GETS SWEATY AND DIZZY-ONCE PAIN RESOLVES, SHE FEELS BETTER  . Asthma    -PT HAS NO INHALERS  . Joint pain    . Medical history non-contributory   . Swelling of elbow joint   . Swelling of knee joint     Past Surgical History:  Procedure Laterality Date  . CHOLECYSTECTOMY N/A 07/21/2015   Procedure: LAPAROSCOPIC CHOLECYSTECTOMY with cholangiogram;  Surgeon: Christene Lye, MD;  Location: [facility] OR[facility];  Service: General;  Laterality: N/A;  . LAPAROSCOPIC REMOVAL OF MESENTERIC MASS N/A 07/21/2015   Procedure: exploration of lesser curvature of the stomach;  Surgeon: Christene Lye, MD;  Location: [facility] OR[facility];  Service: General;  Laterality: N/A;  . LASIK    . NO PAST SURGERIES    . UPPER ESOPHAGEAL ENDOSCOPIC ULTRASOUND (EUS) N/A 03/24/2015   Procedure: UPPER ESOPHAGEAL ENDOSCOPIC ULTRASOUND (EUS);  Surgeon: Jola Schmidt, MD;  Location: [facility] END[facility];  Service: Endoscopy;  Laterality: N/A;    Family History  Problem Relation Age of Onset  . Breast cancer Maternal Aunt     Social History   Social History  . Marital status: Married    Spouse name: N/A  . Number of children: N/A  . Years of education: N/A   Occupational History  . Not on file.   Social History Main Topics  . Smoking status: Never Smoker  . Smokeless tobacco: Never Used  . Alcohol use No  . Drug use: No  . Sexual activity: Yes   Other Topics Concern  . Not on file   Social History Narrative  . No narrative on file    Allergies  Allergen Reactions  .  Percocet [Oxycodone-Acetaminophen] Nausea And Vomiting     Review of Systems  Constitutional: Negative for chills, diaphoresis, fever, malaise/fatigue and weight loss.  HENT: Positive for congestion and ear pain. Negative for ear discharge, hoarse voice, sinus pressure, sneezing and sore throat.   Eyes: Negative for blurred vision.  Respiratory: Positive for cough. Negative for sputum production, shortness of breath and wheezing.   Cardiovascular: Negative for chest pain, palpitations and leg swelling.  Gastrointestinal: Positive for abdominal pain.  Negative for blood in stool, constipation, diarrhea, heartburn, melena and nausea.  Genitourinary: Negative for dysuria, frequency, hematuria and urgency.  Musculoskeletal: Positive for neck pain. Negative for back pain, joint pain and myalgias.  Skin: Negative for rash.  Neurological: Negative for dizziness, tingling, sensory change, focal weakness and headaches.  Endo/Heme/Allergies: Negative for environmental allergies and polydipsia. Does not bruise/bleed easily.  Psychiatric/Behavioral: Negative for depression and suicidal ideas. The patient is not nervous/anxious and does not have insomnia.      Objective  Vitals:   03/12/16 1603  BP: 116/88  Pulse: 74  Temp: 98.1 F (36.7 C)  Weight: 136 lb (61.7 kg)  Height: 5\' 2"  (1.575 m)    Physical Exam  Constitutional: She is well-developed, well-nourished, and in no distress. No distress.  HENT:  Head: Normocephalic and atraumatic.  Right Ear: External ear normal.  Left Ear: External ear normal.  Nose: Nose normal.  Mouth/Throat: Oropharynx is clear and moist.  Eyes: Conjunctivae and EOM are normal. Pupils are equal, round, and reactive to light. Right eye exhibits no discharge. Left eye exhibits no discharge.  Neck: Normal range of motion. Neck supple. No JVD present. No thyromegaly present.  Cardiovascular: Normal rate, regular rhythm, normal heart sounds and intact distal pulses.  Exam reveals no gallop and no friction rub.   No murmur heard. Pulmonary/Chest: Effort normal and breath sounds normal. She has no wheezes. She has no rales.  Abdominal: Soft. Bowel sounds are normal. She exhibits no mass. There is tenderness. There is no rebound and no guarding.  Musculoskeletal: Normal range of motion. She exhibits no edema.  Lymphadenopathy:       Head (right side): No submental and no submandibular adenopathy present.       Head (left side): No submental and no submandibular adenopathy present.    She has no cervical adenopathy.        Right cervical: No superficial cervical and no deep cervical adenopathy present.      Left cervical: No superficial cervical and no deep cervical adenopathy present.    She has no axillary adenopathy.  Neurological: She is alert. She has normal reflexes.  Skin: Skin is warm and dry. She is not diaphoretic.  Psychiatric: Mood and affect normal.  Nursing note and vitals reviewed.     Assessment & Plan  Problem List Items Addressed This Visit    None        Dr. Otilio Miu [facility]  03/12/16

## 2016-03-13 ENCOUNTER — Other Ambulatory Visit: Payer: Self-pay | Admitting: Family Medicine

## 2016-03-13 DIAGNOSIS — E0590 Thyrotoxicosis, unspecified without thyrotoxic crisis or storm: Secondary | ICD-10-CM

## 2016-03-13 LAB — CBC WITH DIFFERENTIAL/PLATELET
BASOS ABS: 0 10*3/uL (ref 0.0–0.2)
Basos: 0 %
EOS (ABSOLUTE): 0 10*3/uL (ref 0.0–0.4)
EOS: 1 %
HEMOGLOBIN: 11.9 g/dL (ref 11.1–15.9)
Hematocrit: 35.2 % (ref 34.0–46.6)
Immature Grans (Abs): 0 10*3/uL (ref 0.0–0.1)
Immature Granulocytes: 0 %
LYMPHS ABS: 1.5 10*3/uL (ref 0.7–3.1)
LYMPHS: 41 %
MCH: 30 pg (ref 26.6–33.0)
MCHC: 33.8 g/dL (ref 31.5–35.7)
MCV: 89 fL (ref 79–97)
MONOS ABS: 0.5 10*3/uL (ref 0.1–0.9)
Monocytes: 13 %
NEUTROS PCT: 45 %
Neutrophils Absolute: 1.7 10*3/uL (ref 1.4–7.0)
PLATELETS: 305 10*3/uL (ref 150–379)
RBC: 3.97 x10E6/uL (ref 3.77–5.28)
RDW: 13.5 % (ref 12.3–15.4)
WBC: 3.7 10*3/uL (ref 3.4–10.8)

## 2016-03-13 LAB — LIPASE: Lipase: 46 U/L (ref 14–72)

## 2016-03-13 LAB — TSH: TSH: 5.48 u[IU]/mL — ABNORMAL HIGH (ref 0.450–4.500)

## 2016-03-13 LAB — HEPATIC FUNCTION PANEL
ALK PHOS: 53 IU/L (ref 39–117)
ALT: 32 IU/L (ref 0–32)
AST: 21 IU/L (ref 0–40)
Albumin: 4.5 g/dL (ref 3.5–5.5)
BILIRUBIN TOTAL: 0.4 mg/dL (ref 0.0–1.2)
Bilirubin, Direct: 0.08 mg/dL (ref 0.00–0.40)
TOTAL PROTEIN: 7.4 g/dL (ref 6.0–8.5)

## 2016-03-15 ENCOUNTER — Ambulatory Visit (INDEPENDENT_AMBULATORY_CARE_PROVIDER_SITE_OTHER): Payer: BLUE CROSS/BLUE SHIELD | Admitting: General Surgery

## 2016-03-15 ENCOUNTER — Encounter: Payer: Self-pay | Admitting: General Surgery

## 2016-03-15 VITALS — BP 118/74 | Resp 12 | Ht 62.0 in | Wt 134.0 lb

## 2016-03-15 DIAGNOSIS — R1900 Intra-abdominal and pelvic swelling, mass and lump, unspecified site: Secondary | ICD-10-CM

## 2016-03-15 DIAGNOSIS — R1012 Left upper quadrant pain: Secondary | ICD-10-CM

## 2016-03-15 DIAGNOSIS — R1011 Right upper quadrant pain: Secondary | ICD-10-CM | POA: Diagnosis not present

## 2016-03-15 MED ORDER — MELOXICAM 7.5 MG PO TABS: 7.5000 mg | ORAL_TABLET | Freq: Every day | ORAL | 0 refills | Status: DC

## 2016-03-15 NOTE — Progress Notes (Signed)
Patient ID: Krystal Hardy, female   DOB: 1980/08/08, 35 y.o.   MRN: [id number]  Chief Complaint  Patient presents with  . Abdominal Pain    HPI Krystal Hardy is a 35 y.o. female here today for upper abdominal pain with nausea she states it started 2 days ago. She states it radiates around to the left side to her back. She states the pain is worse with sitting. She feels like when she eats a lot her stomach bloats. She is on an antibiotic for sinus infection. I have reviewed the history of present illness with the patient.  HPI  Past Medical History:  Diagnosis Date  . Allergy   . Anxiety    PT GETS VERY ANXIOUS WHEN SHE HAS ABDOMINAL PAIN-PT STATES SHE GETS SWEATY AND DIZZY-ONCE PAIN RESOLVES, SHE FEELS BETTER  . Asthma    -PT HAS NO INHALERS  . Joint pain   . Medical history non-contributory   . Swelling of elbow joint   . Swelling of knee joint     Past Surgical History:  Procedure Laterality Date  . CHOLECYSTECTOMY N/A 07/21/2015   Procedure: LAPAROSCOPIC CHOLECYSTECTOMY with cholangiogram;  Surgeon: Christene Lye, MD;  Location: [facility] ORS;  Service: General;  Laterality: N/A;  . LAPAROSCOPIC REMOVAL OF MESENTERIC MASS N/A 07/21/2015   Procedure: exploration of lesser curvature of the stomach;  Surgeon: Christene Lye, MD;  Location: [facility] ORS;  Service: General;  Laterality: N/A;  . LASIK    . NO PAST SURGERIES    . UPPER ESOPHAGEAL ENDOSCOPIC ULTRASOUND (EUS) N/A 03/24/2015   Procedure: UPPER ESOPHAGEAL ENDOSCOPIC ULTRASOUND (EUS);  Surgeon: Jola Schmidt, MD;  Location: [facility] ENDOSCOPY;  Service: Endoscopy;  Laterality: N/A;    Family History  Problem Relation Age of Onset  . Breast cancer Maternal Aunt     Social History Social History  Substance Use Topics  . Smoking status: Never Smoker  . Smokeless tobacco: Never Used  . Alcohol use No    Allergies  Allergen Reactions  . Percocet [Oxycodone-Acetaminophen] Nausea And Vomiting    Current Outpatient  Prescriptions  Medication Sig Dispense Refill  . azithromycin (ZITHROMAX) 250 MG tablet 2 today then 1 a day for 4 days 6 tablet 0  . cetirizine (ZYRTEC) 10 MG tablet Take 10 mg by mouth daily.    Marland Kitchen etodolac (LODINE) 400 MG tablet Take 1 tablet (400 mg total) by mouth 2 (two) times daily. 60 tablet 3  . fluticasone (FLONASE) 50 MCG/ACT nasal spray Place 1 spray into both nostrils daily.  0  . Ibuprofen (ADVIL) 200 MG CAPS Take 1 capsule by mouth as needed.    Marland Kitchen levothyroxine (SYNTHROID, LEVOTHROID) 25 MCG tablet Take 25 mcg by mouth daily before breakfast.    . meloxicam (MOBIC) 7.5 MG tablet Take 1 tablet (7.5 mg total) by mouth daily. 30 tablet 0   No current facility-administered medications for this visit.     Review of Systems Review of Systems  Constitutional: Negative.   Respiratory: Negative.   Cardiovascular: Negative.     Blood pressure 118/74, resp. rate 12, height 5\' 2"  (1.575 m), weight 134 lb (60.8 kg), last menstrual period 03/06/2016.  Physical Exam Physical Exam  Constitutional: She is oriented to person, place, and time. She appears well-developed and well-nourished.  HENT:  Mouth/Throat: Oropharynx is clear and moist.  Eyes: Conjunctivae are normal. No scleral icterus.  Neck: Neck supple.  Cardiovascular: Normal rate, regular rhythm and normal heart sounds.   Pulmonary/Chest: Effort  normal and breath sounds normal.  Abdominal: Soft. Bowel sounds are normal. There is no hepatomegaly. There is tenderness in the right upper quadrant, epigastric area and left upper quadrant. There is no rebound and no guarding.  Lymphadenopathy:    She has no cervical adenopathy.  Neurological: She is alert and oriented to person, place, and time.  Skin: Skin is warm and dry.  Psychiatric: Her behavior is normal.    Data Reviewed Progress notes.  Assessment    Pt describes pain which is different from her initial ruq pain related to her gallstones. On a prior visit she had  some pain on costal margins but this pain is different according to her. There is a known 2.5cm mass near lesser curve of stomach. It is not certain if it is separate from stomach or a gastric wall mass.  Pt wishes to have it biopsied again.  At present there is no apparent explanation for her pain.     Plan   Likely she will benefit with reassessment with endoscopic [place].    Pt was prescribed Oxycodone but she is not tolerating this., Mobic 7.5mg  po daily #30.  Will also discuss at Tumor board.   This information has been scribed by Gaspar Cola CMA.   SANKAR,SEEPLAPUTHUR G 03/16/2016, 10:23 AM

## 2016-03-15 NOTE — Patient Instructions (Signed)
The patient is aware to call back for any questions or concerns.  

## 2016-03-27 ENCOUNTER — Telehealth: Payer: Self-pay | Admitting: *Deleted

## 2016-03-27 NOTE — Telephone Encounter (Signed)
Patient called wanting to know if you have any news for her from the tumor board last week.

## 2016-03-28 ENCOUNTER — Telehealth: Payer: Self-pay

## 2016-04-04 ENCOUNTER — Ambulatory Visit: Payer: BLUE CROSS/BLUE SHIELD | Admitting: Family Medicine

## 2016-04-05 ENCOUNTER — Other Ambulatory Visit: Payer: Self-pay

## 2016-04-05 ENCOUNTER — Ambulatory Visit: Payer: BLUE CROSS/BLUE SHIELD | Admitting: Family Medicine

## 2016-04-05 DIAGNOSIS — R1012 Left upper quadrant pain: Secondary | ICD-10-CM

## 2016-04-05 DIAGNOSIS — R1900 Intra-abdominal and pelvic swelling, mass and lump, unspecified site: Secondary | ICD-10-CM

## 2016-04-10 ENCOUNTER — Inpatient Hospital Stay: Payer: BLUE CROSS/BLUE SHIELD | Attending: Internal Medicine | Admitting: Internal Medicine

## 2016-04-10 ENCOUNTER — Inpatient Hospital Stay: Payer: BLUE CROSS/BLUE SHIELD

## 2016-04-10 ENCOUNTER — Encounter: Payer: Self-pay | Admitting: Internal Medicine

## 2016-04-10 DIAGNOSIS — R1900 Intra-abdominal and pelvic swelling, mass and lump, unspecified site: Secondary | ICD-10-CM | POA: Diagnosis not present

## 2016-04-10 DIAGNOSIS — M2550 Pain in unspecified joint: Secondary | ICD-10-CM | POA: Insufficient documentation

## 2016-04-10 DIAGNOSIS — M25429 Effusion, unspecified elbow: Secondary | ICD-10-CM | POA: Diagnosis not present

## 2016-04-10 DIAGNOSIS — F419 Anxiety disorder, unspecified: Secondary | ICD-10-CM | POA: Diagnosis not present

## 2016-04-10 DIAGNOSIS — M25469 Effusion, unspecified knee: Secondary | ICD-10-CM

## 2016-04-10 DIAGNOSIS — R1013 Epigastric pain: Secondary | ICD-10-CM | POA: Diagnosis not present

## 2016-04-10 DIAGNOSIS — Z803 Family history of malignant neoplasm of breast: Secondary | ICD-10-CM

## 2016-04-10 DIAGNOSIS — Z9049 Acquired absence of other specified parts of digestive tract: Secondary | ICD-10-CM | POA: Diagnosis not present

## 2016-04-10 DIAGNOSIS — R590 Localized enlarged lymph nodes: Secondary | ICD-10-CM

## 2016-04-10 DIAGNOSIS — K5900 Constipation, unspecified: Secondary | ICD-10-CM | POA: Diagnosis not present

## 2016-04-10 DIAGNOSIS — R1909 Other intra-abdominal and pelvic swelling, mass and lump: Secondary | ICD-10-CM

## 2016-04-10 NOTE — Progress Notes (Signed)
Patient here today as a new patient referred by Dr Jamal Collin for abdominal mass.

## 2016-04-10 NOTE — Assessment & Plan Note (Signed)
Etiology of abdominal pain is unclear. Previous workup including for pancreatitis is negative. I'm not sure if the ~ 2 centimeter mass noted around the lesser curvature of the stomach is the cause of the abdominal pain. Recommend ruling out other causes including- acute porphyria; check hepatitis C and a C-reactive protein. Also recommend Nexium twice a day.   # Abdominal mass/ approximately 2 cm around the lesser curvature of the stomach- previously used biopsy spindle cells noted [GIST versus schwannoma]. Again less likely to because of the patient's abdominal pain.However if above workup is negative patient; and continues to be symptomatic recommend PET scan for further evaluation.  # Mild retroperitoneal adenopathy- unclear etiology. Monitor for now.  # Patient will follow-up with me in approximately 1 week/based upon above blood work.    I reviewed the images myself and with the patient;   Thank you Dr. Jamal Collin for allowing me to participate in the care of your pleasant patient.
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
## 2016-04-11 LAB — HEPATITIS C ANTIBODY: HCV Ab: 0.1 s/co ratio (ref 0.0–0.9)

## 2016-04-11 LAB — C-REACTIVE PROTEIN: CRP: 0.8 mg/dL (ref <1.0)

## 2016-04-13 ENCOUNTER — Inpatient Hospital Stay: Payer: BLUE CROSS/BLUE SHIELD

## 2016-04-13 DIAGNOSIS — R1900 Intra-abdominal and pelvic swelling, mass and lump, unspecified site: Secondary | ICD-10-CM | POA: Diagnosis not present

## 2016-04-16 ENCOUNTER — Telehealth: Payer: Self-pay | Admitting: *Deleted

## 2016-04-16 ENCOUNTER — Telehealth: Payer: Self-pay | Admitting: Internal Medicine

## 2016-04-16 ENCOUNTER — Ambulatory Visit (INDEPENDENT_AMBULATORY_CARE_PROVIDER_SITE_OTHER): Payer: BLUE CROSS/BLUE SHIELD | Admitting: Family Medicine

## 2016-04-16 ENCOUNTER — Other Ambulatory Visit: Payer: Self-pay | Admitting: Internal Medicine

## 2016-04-16 ENCOUNTER — Encounter: Payer: Self-pay | Admitting: Family Medicine

## 2016-04-16 VITALS — BP 110/80 | HR 99 | Temp 97.8°F | Ht 62.0 in | Wt 135.0 lb

## 2016-04-16 DIAGNOSIS — E039 Hypothyroidism, unspecified: Secondary | ICD-10-CM

## 2016-04-16 DIAGNOSIS — C49A2 Gastrointestinal stromal tumor of stomach: Secondary | ICD-10-CM | POA: Insufficient documentation

## 2016-04-16 DIAGNOSIS — J40 Bronchitis, not specified as acute or chronic: Secondary | ICD-10-CM

## 2016-04-16 MED ORDER — LEVOTHYROXINE SODIUM 25 MCG PO TABS: 25.0000 ug | ORAL_TABLET | Freq: Every day | ORAL | 1 refills | Status: DC

## 2016-04-16 MED ORDER — AZITHROMYCIN 250 MG PO TABS: ORAL_TABLET | ORAL | 0 refills | Status: DC

## 2016-04-16 NOTE — Progress Notes (Signed)
Name: Krystal Hardy   MRN: [id number]    DOB: 1980-06-20   Date:04/16/2016       Progress Note  Subjective  Chief Complaint  Chief Complaint  Patient presents with  . Follow-up    on thyroid   . Cough    runny nose, itchy throat    Cough  This is a new problem. The current episode started in the past 7 days. The problem has been waxing and waning. The problem occurs every few minutes. The cough is non-productive. Pertinent negatives include no chest pain, chills, ear pain, fever, headaches, heartburn, myalgias, rash, sore throat, shortness of breath, weight loss or wheezing. Nothing aggravates the symptoms. The treatment provided mild relief. There is no history of asthma, bronchiectasis, bronchitis, COPD, emphysema, environmental allergies or pneumonia.  Thyroid Problem  Presents for follow-up visit. Patient reports no anxiety, cold intolerance, constipation, depressed mood, diarrhea, hair loss, heat intolerance, palpitations, weight gain or weight loss. The symptoms have been stable.    No problem-specific Assessment & Plan notes found for this encounter.   Past Medical History:  Diagnosis Date  . Allergy   . Anxiety    PT GETS VERY ANXIOUS WHEN SHE HAS ABDOMINAL PAIN-PT STATES SHE GETS SWEATY AND DIZZY-ONCE PAIN RESOLVES, SHE FEELS BETTER  . Asthma    -PT HAS NO INHALERS  . IUD (intrauterine device) in place   . Joint pain   . Medical history non-contributory   . Swelling of elbow joint   . Swelling of knee joint     Past Surgical History:  Procedure Laterality Date  . CHOLECYSTECTOMY N/A 07/21/2015   Procedure: LAPAROSCOPIC CHOLECYSTECTOMY with cholangiogram;  Surgeon: Christene Lye, MD;  Location: [facility] ORS;  Service: General;  Laterality: N/A;  . LAPAROSCOPIC REMOVAL OF MESENTERIC MASS N/A 07/21/2015   Procedure: exploration of lesser curvature of the stomach;  Surgeon: Christene Lye, MD;  Location: [facility] ORS;  Service: General;  Laterality: N/A;  . LASIK    .  NO PAST SURGERIES    . UPPER ESOPHAGEAL ENDOSCOPIC ULTRASOUND (EUS) N/A 03/24/2015   Procedure: UPPER ESOPHAGEAL ENDOSCOPIC ULTRASOUND (EUS);  Surgeon: Jola Schmidt, MD;  Location: [facility] ENDOSCOPY;  Service: Endoscopy;  Laterality: N/A;    Family History  Problem Relation Age of Onset  . Breast cancer Maternal Aunt     Social History   Social History  . Marital status: Married    Spouse name: N/A  . Number of children: N/A  . Years of education: N/A   Occupational History  . Not on file.   Social History Main Topics  . Smoking status: Never Smoker  . Smokeless tobacco: Never Used  . Alcohol use No  . Drug use: No  . Sexual activity: Yes   Other Topics Concern  . Not on file   Social History Narrative  . No narrative on file    Allergies  Allergen Reactions  . Other Nausea And Vomiting    Epidural anesthesia  . Percocet [Oxycodone-Acetaminophen] Nausea And Vomiting     Review of Systems  Constitutional: Negative for chills, fever, malaise/fatigue, weight gain and weight loss.  HENT: Negative for ear discharge, ear pain and sore throat.   Eyes: Negative for blurred vision.  Respiratory: Negative for cough, sputum production, shortness of breath and wheezing.   Cardiovascular: Negative for chest pain, palpitations and leg swelling.  Gastrointestinal: Negative for abdominal pain, blood in stool, constipation, diarrhea, heartburn, melena and nausea.  Genitourinary: Negative for dysuria, frequency,  hematuria and urgency.  Musculoskeletal: Negative for back pain, joint pain, myalgias and neck pain.  Skin: Negative for rash.  Neurological: Negative for dizziness, tingling, sensory change, focal weakness and headaches.  Endo/Heme/Allergies: Negative for environmental allergies, cold intolerance, heat intolerance and polydipsia. Does not bruise/bleed easily.  Psychiatric/Behavioral: Negative for depression and suicidal ideas. The patient is not nervous/anxious and does not  have insomnia.      Objective  Vitals:   04/16/16 1003  BP: 110/80  Pulse: 99  Temp: 97.8 F (36.6 C)  Weight: 135 lb (61.2 kg)  Height: 5\' 2"  (1.575 m)    Physical Exam  Constitutional: She is well-developed, well-nourished, and in no distress. No distress.  HENT:  Head: Normocephalic and atraumatic.  Right Ear: External ear normal.  Left Ear: External ear normal.  Nose: Nose normal.  Mouth/Throat: Oropharynx is clear and moist.  Eyes: Conjunctivae and EOM are normal. Pupils are equal, round, and reactive to light. Right eye exhibits no discharge. Left eye exhibits no discharge.  Neck: Normal range of motion. Neck supple. No JVD present. No thyromegaly present.  Cardiovascular: Normal rate, regular rhythm, normal heart sounds and intact distal pulses.  Exam reveals no gallop and no friction rub.   No murmur heard. Pulmonary/Chest: Effort normal and breath sounds normal. She has no wheezes. She has no rales.  Abdominal: Soft. Bowel sounds are normal. She exhibits no mass. There is no tenderness. There is no guarding.  Musculoskeletal: Normal range of motion. She exhibits no edema.  Lymphadenopathy:    She has no cervical adenopathy.  Neurological: She is alert. She has normal reflexes.  Skin: Skin is warm and dry. She is not diaphoretic.  Psychiatric: Mood and affect normal.  Nursing note and vitals reviewed.     Assessment & Plan  Problem List Items Addressed This Visit    None    Visit Diagnoses    Bronchitis    -  Primary   Relevant Medications   azithromycin (ZITHROMAX) 250 MG tablet   Hypothyroidism, unspecified type       Relevant Medications   levothyroxine (SYNTHROID, LEVOTHROID) 25 MCG tablet   Other Relevant Orders   TSH        Dr. Adalyne Lovick [facility]  04/16/16

## 2016-04-16 NOTE — Telephone Encounter (Signed)
Scheduling msg sent to Krystal Hardy in [place] to arrange for pet scan and r/s md apt until s/p pet

## 2016-04-16 NOTE — Telephone Encounter (Signed)
Received call from [facility] in [place] [facility] at [place] stating the 24 hour Urine for Porphobilinogen was rejected. Specimen needed to be frozen and Protected from light. Lab staff in [place] was not aware of specimen qualifications. Patient will need to recollect this 24 hour specimen.

## 2016-04-16 NOTE — Telephone Encounter (Signed)
Spoke to Dr. Jamal Collin- agrees with PET; recommend PET/ordered. Re-schedule visit to few days after PET. Please inform pt.

## 2016-04-17 ENCOUNTER — Ambulatory Visit: Payer: BLUE CROSS/BLUE SHIELD | Admitting: Internal Medicine

## 2016-04-17 LAB — TSH: TSH: 1.82 u[IU]/mL (ref 0.450–4.500)

## 2016-04-17 NOTE — Telephone Encounter (Signed)
md notified

## 2016-04-23 ENCOUNTER — Ambulatory Visit: Payer: BLUE CROSS/BLUE SHIELD

## 2016-04-23 ENCOUNTER — Telehealth: Payer: Self-pay | Admitting: Internal Medicine

## 2016-04-23 ENCOUNTER — Telehealth: Payer: Self-pay | Admitting: *Deleted

## 2016-04-23 DIAGNOSIS — R1909 Other intra-abdominal and pelvic swelling, mass and lump: Secondary | ICD-10-CM

## 2016-04-23 DIAGNOSIS — R1013 Epigastric pain: Secondary | ICD-10-CM

## 2016-04-23 NOTE — Telephone Encounter (Signed)
PET not approved; follow up as planned. Dr.B

## 2016-04-23 NOTE — Telephone Encounter (Signed)
Spoke with patient. Pet scan not approved. Dr. B wants the patient to collect her 24 hr urine first before r/s her apt. Once these results are back, md will make an apt with the patient. patient will come at 830am tomorrow to pick up new 24 hr urine kit. Explained to patient that her urine needs to be recollected and put on ice and protected from ice. She gave verbal understanding of collection process. New order entered in chl for 24 hr.

## 2016-04-24 ENCOUNTER — Ambulatory Visit: Payer: BLUE CROSS/BLUE SHIELD | Admitting: Internal Medicine

## 2016-04-24 ENCOUNTER — Ambulatory Visit: Admission: RE | Admit: 2016-04-24 | Payer: BLUE CROSS/BLUE SHIELD | Source: Ambulatory Visit

## 2016-04-25 ENCOUNTER — Inpatient Hospital Stay: Payer: BLUE CROSS/BLUE SHIELD | Admitting: Internal Medicine

## 2016-04-26 DIAGNOSIS — R1900 Intra-abdominal and pelvic swelling, mass and lump, unspecified site: Secondary | ICD-10-CM | POA: Diagnosis not present

## 2016-04-27 ENCOUNTER — Other Ambulatory Visit: Payer: Self-pay | Admitting: *Deleted

## 2016-04-27 DIAGNOSIS — R1909 Other intra-abdominal and pelvic swelling, mass and lump: Secondary | ICD-10-CM

## 2016-04-27 DIAGNOSIS — R1013 Epigastric pain: Secondary | ICD-10-CM

## 2016-04-30 LAB — MISC LABCORP TEST (SEND OUT): Labcorp test code: 3103

## 2016-05-08 ENCOUNTER — Telehealth: Payer: Self-pay

## 2016-05-08 DIAGNOSIS — R1909 Other intra-abdominal and pelvic swelling, mass and lump: Secondary | ICD-10-CM

## 2016-05-08 NOTE — Telephone Encounter (Signed)
  Oncology Nurse Navigator Documentation Received referral for EUS for perigastirc soft tissue mass with biopsy. No availability at [facility] until 2/22. Dr. Burlene Arnt would like EUS sooner. Spoke with Ms. Hakeem and she is agreeable to have procedure performed at [facility] with results sent to Dr. Rogue Bussing. Referral sent and they will contact her with appointment date/time/instructions. Navigator Location: [facility] (05/08/16 1000)   )Navigator Encounter Type: Telephone (05/08/16 1000) Telephone: [place] Call (05/08/16 1000)                       Barriers/Navigation Needs: Coordination of Care (05/08/16 1000)   Interventions: Coordination of Care (05/08/16 1000)   Coordination of Care: EUS (05/08/16 1000)                  Time Spent with Patient: 45 (05/08/16 1000)

## 2016-05-08 NOTE — Progress Notes (Signed)
  Oncology Nurse Navigator Documentation EUS has been scheduled for 05/16/16 with Dr. Mont Dutton at [facility]. Navigator Location: [facility] (05/08/16 1500)   )                                  Coordination of Care: EUS (05/08/16 1500)                  Time Spent with Patient: 15 (05/08/16 1500)

## 2016-05-22 ENCOUNTER — Telehealth: Payer: Self-pay

## 2016-05-22 NOTE — Telephone Encounter (Signed)
  Oncology Nurse Navigator Documentation Called and notified Krystal Hardy of negative biopsies from EUS. She does however have H. Pylori that needs treatment. She does not have a local GI and prefers for Dr. Mont Dutton to treat and do the follow up. Dr. Mont Dutton notified regarding.She does report persistent pain and decreased appetite. Symptoms of H. pylori can include abdominal ache or burning along with nausea and loss of appetite, and weight loss. Dr. Rogue Bussing, do you need to see her for follow up? Please advise as she has questions regarding these persistent symptoms. Navigator Location: [facility] (05/22/16 1400)   )Navigator Encounter Type: Telephone (05/22/16 1400) Telephone: Symptom Mgt;Diagnostic Results;Outgoing Call (05/22/16 1400)                                                  Time Spent with Patient: 30 (05/22/16 1400)

## 2016-05-22 NOTE — Telephone Encounter (Signed)
Krystal Hardy- per Dr. Rogue Bussing- no need to f/u in cancer center - have pt f/u with her pcp

## 2016-05-22 NOTE — Telephone Encounter (Signed)
Notified Krystal Hardy to follow up with her PCP regarding her pain. She does not need to return to [facility].

## 2016-05-23 NOTE — Telephone Encounter (Signed)
error 

## 2016-06-15 ENCOUNTER — Other Ambulatory Visit: Payer: Self-pay

## 2016-07-11 ENCOUNTER — Encounter: Payer: Self-pay | Admitting: Gastroenterology

## 2016-07-11 ENCOUNTER — Ambulatory Visit (INDEPENDENT_AMBULATORY_CARE_PROVIDER_SITE_OTHER): Payer: BLUE CROSS/BLUE SHIELD | Admitting: Gastroenterology

## 2016-07-11 VITALS — BP 101/53 | HR 68 | Temp 97.8°F | Ht 62.0 in | Wt 135.5 lb

## 2016-07-11 DIAGNOSIS — A048 Other specified bacterial intestinal infections: Secondary | ICD-10-CM | POA: Diagnosis not present

## 2016-07-11 DIAGNOSIS — R1013 Epigastric pain: Secondary | ICD-10-CM | POA: Diagnosis not present

## 2016-07-11 DIAGNOSIS — G8929 Other chronic pain: Secondary | ICD-10-CM

## 2016-07-11 MED ORDER — CLARITHROMYCIN 500 MG PO TABS: 500.0000 mg | ORAL_TABLET | Freq: Two times a day (BID) | ORAL | 0 refills | Status: DC

## 2016-07-11 MED ORDER — AMOXICILLIN 500 MG PO CAPS: 1000.0000 mg | ORAL_CAPSULE | Freq: Two times a day (BID) | ORAL | 0 refills | Status: DC

## 2016-07-11 MED ORDER — ESOMEPRAZOLE MAGNESIUM 20 MG PO CPDR: 20.0000 mg | DELAYED_RELEASE_CAPSULE | Freq: Every day | ORAL | 3 refills | Status: DC

## 2016-07-12 NOTE — Progress Notes (Signed)
Gastroenterology Consultation  Referring Provider:     Juline Patch, MD Primary Care Physician:  Otilio Miu, MD Primary Gastroenterologist:  Dr. Allen Norris     Reason for Consultation:     Abdominal pain        HPI:   Krystal Hardy is a 36 y.o. y/o female referred for consultation & management of Abdominal pain by Dr. Otilio Miu, MD.  This woman comes in today with a history of epigastric pain. The patient has had this pain for many years. The patient underwent an EUS in the past and was found to have a lesion in her stomach that was found to be a lymph node and biopsied and was reported to be benign. The patient was told that she did not have to follow with more. The patient states that she was started on Nexium and states her abdominal pain had gone away. The patient was also found on the EUS as per the report to have H. pylori infection. The patient states she has not taken antibiotics for the H. pylori infection. She is also concerned about how long she needs to be on a PPI.  Past Medical History:  Diagnosis Date  . Allergy   . Anxiety    PT GETS VERY ANXIOUS WHEN SHE HAS ABDOMINAL PAIN-PT STATES SHE GETS SWEATY AND DIZZY-ONCE PAIN RESOLVES, SHE FEELS BETTER  . Asthma    -PT HAS NO INHALERS  . IUD (intrauterine device) in place   . Joint pain   . Medical history non-contributory   . Swelling of elbow joint   . Swelling of knee joint     Past Surgical History:  Procedure Laterality Date  . CHOLECYSTECTOMY N/A 07/21/2015   Procedure: LAPAROSCOPIC CHOLECYSTECTOMY with cholangiogram;  Surgeon: Christene Lye, MD;  Location: [facility] ORS;  Service: General;  Laterality: N/A;  . LAPAROSCOPIC REMOVAL OF MESENTERIC MASS N/A 07/21/2015   Procedure: exploration of lesser curvature of the stomach;  Surgeon: Christene Lye, MD;  Location: [facility] ORS;  Service: General;  Laterality: N/A;  . LASIK    . NO PAST SURGERIES    . UPPER ESOPHAGEAL ENDOSCOPIC ULTRASOUND (EUS) N/A 03/24/2015     Procedure: UPPER ESOPHAGEAL ENDOSCOPIC ULTRASOUND (EUS);  Surgeon: Jola Schmidt, MD;  Location: [facility] ENDOSCOPY;  Service: Endoscopy;  Laterality: N/A;    Prior to Admission medications   Medication Sig Start Date End Date Taking? Authorizing Provider  cetirizine (ZYRTEC) 10 MG tablet Take 10 mg by mouth daily.   Yes Historical Provider, MD  esomeprazole (NEXIUM) 20 MG capsule Take by mouth.   Yes Historical Provider, MD  fluticasone (FLONASE) 50 MCG/ACT nasal spray Place 1 spray into both nostrils daily. 04/18/15  Yes Historical Provider, MD  levonorgestrel (MIRENA) 20 MCG/24HR IUD 1 each by Intrauterine route once.   Yes Historical Provider, MD  levothyroxine (SYNTHROID, LEVOTHROID) 25 MCG tablet Take 1 tablet (25 mcg total) by mouth daily before breakfast. 04/16/16  Yes Juline Patch, MD  amoxicillin (AMOXIL) 500 MG capsule Take 2 capsules (1,000 mg total) by mouth 2 (two) times daily. 07/11/16   Lucilla Lame, MD  clarithromycin (BIAXIN) 500 MG tablet Take 1 tablet (500 mg total) by mouth 2 (two) times daily. 07/11/16   Lucilla Lame, MD  esomeprazole (NEXIUM) 20 MG capsule Take 1 capsule (20 mg total) by mouth daily at 12 noon. 07/11/16   Lucilla Lame, MD    Family History  Problem Relation Age of Onset  . Breast cancer  Maternal Aunt      Social History  Substance Use Topics  . Smoking status: Never Smoker  . Smokeless tobacco: Never Used  . Alcohol use No    Allergies as of 07/11/2016 - Review Complete 07/11/2016  Allergen Reaction Noted  . Albuterol Swelling 05/15/2016  . Other Nausea And Vomiting 04/10/2016  . Percocet [oxycodone-acetaminophen] Nausea And Vomiting 07/21/2015    Review of Systems:    All systems reviewed and negative except where noted in HPI.   Physical Exam:  BP (!) 101/53   Pulse 68   Temp 97.8 F (36.6 C) (Oral)   Ht 5\' 2"  (1.575 m)   Wt 135 lb 8 oz (61.5 kg)   BMI 24.78 kg/m  No LMP recorded. Psych:  Alert and cooperative. Normal mood and  affect. General:   Alert,  Well-developed, well-nourished, pleasant and cooperative in NAD Head:  Normocephalic and atraumatic. Eyes:  Sclera clear, no icterus.   Conjunctiva pink. Ears:  Normal auditory acuity. Nose:  No deformity, discharge, or lesions. Mouth:  No deformity or lesions,oropharynx pink & moist. Neck:  Supple; no masses or thyromegaly. Lungs:  Respirations even and unlabored.  Clear throughout to auscultation.   No wheezes, crackles, or rhonchi. No acute distress. Heart:  Regular rate and rhythm; no murmurs, clicks, rubs, or gallops. Abdomen:  Normal bowel sounds.  No bruits.  Soft, non-tender and non-distended without masses, hepatosplenomegaly or hernias noted.  No guarding or rebound tenderness.  Negative Carnett sign.   Rectal:  Deferred.  Msk:  Symmetrical without gross deformities.  Good, equal movement & strength bilaterally. Pulses:  Normal pulses noted. Extremities:  No clubbing or edema.  No cyanosis. Neurologic:  Alert and oriented x3;  grossly normal neurologically. Skin:  Intact without significant lesions or rashes.  No jaundice. Lymph Nodes:  No significant cervical adenopathy. Psych:  Alert and cooperative. Normal mood and affect.  Imaging Studies: No results found.  Assessment and Plan:   Krystal Hardy is a 36 y.o. y/o female who comes in today with a history of abdominal pain that has now improved greatly with a PPI. The patient was also found to have H. pylori. The patient and EUS with a lymph node that was enlarged that she reports to have been deemed benign. The patient will be treated for her H. pylori and tested to confirm eradication in 6 weeks. The patient has been told that she can try to stop her PPI after eradicated the H. pylori and see if her symptoms return. If they do she may need to be on chronic PPI treatment otherwise she may be able to stop her PPI treatment.    Lucilla Lame, MD. Marval Regal   Note: This dictation was prepared with Dragon  dictation along with smaller phrase technology. Any transcriptional errors that result from this process are unintentional.

## 2016-07-27 ENCOUNTER — Other Ambulatory Visit: Payer: Self-pay

## 2016-08-10 ENCOUNTER — Encounter: Payer: Self-pay | Admitting: Family Medicine

## 2016-08-10 ENCOUNTER — Ambulatory Visit (INDEPENDENT_AMBULATORY_CARE_PROVIDER_SITE_OTHER): Payer: BLUE CROSS/BLUE SHIELD | Admitting: Family Medicine

## 2016-08-10 VITALS — BP 100/64 | HR 80 | Ht 62.0 in | Wt 137.0 lb

## 2016-08-10 DIAGNOSIS — H1013 Acute atopic conjunctivitis, bilateral: Secondary | ICD-10-CM | POA: Diagnosis not present

## 2016-08-10 DIAGNOSIS — J301 Allergic rhinitis due to pollen: Secondary | ICD-10-CM | POA: Diagnosis not present

## 2016-08-10 MED ORDER — PREDNISONE 10 MG PO TABS: 10.0000 mg | ORAL_TABLET | Freq: Every day | ORAL | 0 refills | Status: DC

## 2016-08-10 MED ORDER — MONTELUKAST SODIUM 10 MG PO TABS: 10.0000 mg | ORAL_TABLET | Freq: Every day | ORAL | 3 refills | Status: DC

## 2016-08-10 NOTE — Patient Instructions (Signed)
Allergic Rhinitis Allergic rhinitis is when the mucous membranes in the nose respond to allergens. Allergens are particles in the air that cause your body to have an allergic reaction. This causes you to release allergic antibodies. Through a chain of events, these eventually cause you to release histamine into the blood stream. Although meant to protect the body, it is this release of histamine that causes your discomfort, such as frequent sneezing, congestion, and an itchy, runny nose. What are the causes? Seasonal allergic rhinitis (hay fever) is caused by pollen allergens that may come from grasses, trees, and weeds. Year-round allergic rhinitis (perennial allergic rhinitis) is caused by allergens such as house dust mites, pet dander, and mold spores. What are the signs or symptoms?  Nasal stuffiness (congestion).  Itchy, runny nose with sneezing and tearing of the eyes. How is this diagnosed? Your health care provider can help you determine the allergen or allergens that trigger your symptoms. If you and your health care provider are unable to determine the allergen, skin or blood testing may be used. Your health care provider will diagnose your condition after taking your health history and performing a physical exam. Your health care provider may assess you for other related conditions, such as asthma, pink eye, or an ear infection. How is this treated? Allergic rhinitis does not have a cure, but it can be controlled by:  Medicines that block allergy symptoms. These may include allergy shots, nasal sprays, and oral antihistamines.  Avoiding the allergen. Hay fever may often be treated with antihistamines in pill or nasal spray forms. Antihistamines block the effects of histamine. There are over-the-counter medicines that may help with nasal congestion and swelling around the eyes. Check with your health care provider before taking or giving this medicine. If avoiding the allergen or the  medicine prescribed do not work, there are many new medicines your health care provider can prescribe. Stronger medicine may be used if initial measures are ineffective. Desensitizing injections can be used if medicine and avoidance does not work. Desensitization is when a patient is given ongoing shots until the body becomes less sensitive to the allergen. Make sure you follow up with your health care provider if problems continue. Follow these instructions at home: It is not possible to completely avoid allergens, but you can reduce your symptoms by taking steps to limit your exposure to them. It helps to know exactly what you are allergic to so that you can avoid your specific triggers. Contact a health care provider if:  You have a fever.  You develop a cough that does not stop easily (persistent).  You have shortness of breath.  You start wheezing.  Symptoms interfere with normal daily activities. This information is not intended to replace advice given to you by your health care provider. Make sure you discuss any questions you have with your health care provider. Document Released: 12/19/2000 Document Revised: 11/25/2015 Document Reviewed: 12/01/2012 Elsevier Interactive Patient Education  2017 [employer]  

## 2016-08-10 NOTE — Progress Notes (Signed)
Name: Krystal Hardy   MRN: [id number]    DOB: 09/11/1980   Date:08/10/2016       Progress Note  Subjective  Chief Complaint  Chief Complaint  Patient presents with  . Allergic Rhinitis     itchy eyes- taking zyrtec and flonase nasal spray- not helping. Had 2 antibiotics in April    Sinus Problem  This is a new problem. The current episode started 1 to 4 weeks ago. The problem has been gradually worsening since onset. There has been no fever. Associated symptoms include congestion, headaches, shortness of breath, sinus pressure and sneezing. Pertinent negatives include no chills, coughing, ear pain, neck pain or sore throat. Treatments tried: antihistamine. The treatment provided mild relief.    No problem-specific Assessment & Plan notes found for this encounter.   Past Medical History:  Diagnosis Date  . Allergy   . Anxiety    PT GETS VERY ANXIOUS WHEN SHE HAS ABDOMINAL PAIN-PT STATES SHE GETS SWEATY AND DIZZY-ONCE PAIN RESOLVES, SHE FEELS BETTER  . Asthma    -PT HAS NO INHALERS  . IUD (intrauterine device) in place   . Joint pain   . Medical history non-contributory   . Swelling of elbow joint   . Swelling of knee joint     Past Surgical History:  Procedure Laterality Date  . CHOLECYSTECTOMY N/A 07/21/2015   Procedure: LAPAROSCOPIC CHOLECYSTECTOMY with cholangiogram;  Surgeon: Christene Lye, MD;  Location: [facility] ORS;  Service: General;  Laterality: N/A;  . LAPAROSCOPIC REMOVAL OF MESENTERIC MASS N/A 07/21/2015   Procedure: exploration of lesser curvature of the stomach;  Surgeon: Christene Lye, MD;  Location: [facility] ORS;  Service: General;  Laterality: N/A;  . LASIK    . NO PAST SURGERIES    . UPPER ESOPHAGEAL ENDOSCOPIC ULTRASOUND (EUS) N/A 03/24/2015   Procedure: UPPER ESOPHAGEAL ENDOSCOPIC ULTRASOUND (EUS);  Surgeon: Jola Schmidt, MD;  Location: [facility] ENDOSCOPY;  Service: Endoscopy;  Laterality: N/A;    Family History  Problem Relation Age of Onset  . Breast  cancer Maternal Aunt     Social History   Social History  . Marital status: Married    Spouse name: N/A  . Number of children: N/A  . Years of education: N/A   Occupational History  . Not on file.   Social History Main Topics  . Smoking status: Never Smoker  . Smokeless tobacco: Never Used  . Alcohol use No  . Drug use: No  . Sexual activity: Yes   Other Topics Concern  . Not on file   Social History Narrative  . No narrative on file    Allergies  Allergen Reactions  . Albuterol Swelling    Pt. States face swelled with use of inhaler,  Not sure which one for asthma  . Other Nausea And Vomiting    Epidural anesthesia  . Percocet [Oxycodone-Acetaminophen] Nausea And Vomiting    Outpatient Medications Prior to Visit  Medication Sig Dispense Refill  . cetirizine (ZYRTEC) 10 MG tablet Take 10 mg by mouth daily.    Marland Kitchen esomeprazole (NEXIUM) 20 MG capsule Take by mouth.    . fluticasone (FLONASE) 50 MCG/ACT nasal spray Place 1 spray into both nostrils daily.  0  . levonorgestrel (MIRENA) 20 MCG/24HR IUD 1 each by Intrauterine route once.    Marland Kitchen levothyroxine (SYNTHROID, LEVOTHROID) 25 MCG tablet Take 1 tablet (25 mcg total) by mouth daily before breakfast. 90 tablet 1  . amoxicillin (AMOXIL) 500 MG capsule Take 2 capsules (  1,000 mg total) by mouth 2 (two) times daily. 56 capsule 0  . clarithromycin (BIAXIN) 500 MG tablet Take 1 tablet (500 mg total) by mouth 2 (two) times daily. 28 tablet 0  . esomeprazole (NEXIUM) 20 MG capsule Take 1 capsule (20 mg total) by mouth daily at 12 noon. 30 capsule 3   No facility-administered medications prior to visit.     Review of Systems  Constitutional: Negative for chills, fever, malaise/fatigue and weight loss.  HENT: Positive for congestion, sinus pressure and sneezing. Negative for ear discharge, ear pain and sore throat.   Eyes: Negative for blurred vision.  Respiratory: Positive for shortness of breath. Negative for cough, sputum  production and wheezing.   Cardiovascular: Negative for chest pain, palpitations and leg swelling.  Gastrointestinal: Negative for abdominal pain, blood in stool, constipation, diarrhea, heartburn, melena and nausea.  Genitourinary: Negative for dysuria, frequency, hematuria and urgency.  Musculoskeletal: Negative for back pain, joint pain, myalgias and neck pain.  Skin: Negative for rash.  Neurological: Positive for headaches. Negative for dizziness, tingling, sensory change and focal weakness.  Endo/Heme/Allergies: Negative for environmental allergies and polydipsia. Does not bruise/bleed easily.  Psychiatric/Behavioral: Negative for depression and suicidal ideas. The patient is not nervous/anxious and does not have insomnia.      Objective  Vitals:   08/10/16 1036  BP: 100/64  Pulse: 80  Weight: 137 lb (62.1 kg)  Height: 5\' 2"  (1.575 m)    Physical Exam  Constitutional: She is well-developed, well-nourished, and in no distress. No distress.  HENT:  Head: Normocephalic and atraumatic.  Right Ear: External ear normal.  Left Ear: External ear normal.  Nose: Nose normal.  Mouth/Throat: Oropharynx is clear and moist.  Eyes: Conjunctivae and EOM are normal. Pupils are equal, round, and reactive to light. Right eye exhibits no discharge. Left eye exhibits no discharge.  Neck: Normal range of motion. Neck supple. No JVD present. No thyromegaly present.  Cardiovascular: Normal rate, regular rhythm, normal heart sounds and intact distal pulses.  Exam reveals no gallop and no friction rub.   No murmur heard. Pulmonary/Chest: Effort normal and breath sounds normal. She has no wheezes. She has no rales.  Abdominal: Soft. Bowel sounds are normal. She exhibits no mass. There is no tenderness. There is no guarding.  Musculoskeletal: Normal range of motion. She exhibits no edema.  Lymphadenopathy:    She has no cervical adenopathy.  Neurological: She is alert. She has normal reflexes.   Skin: Skin is warm and dry. She is not diaphoretic.  Psychiatric: Mood and affect normal.  Nursing note and vitals reviewed.     Assessment & Plan  Problem List Items Addressed This Visit    None    Visit Diagnoses    Seasonal allergic rhinitis due to pollen    -  Primary   Relevant Medications   montelukast (SINGULAIR) 10 MG tablet   predniSONE (DELTASONE) 10 MG tablet   Allergic conjunctivitis of both eyes          Meds ordered this encounter  Medications  . montelukast (SINGULAIR) 10 MG tablet    Sig: Take 1 tablet (10 mg total) by mouth at bedtime.    Dispense:  30 tablet    Refill:  3  . predniSONE (DELTASONE) 10 MG tablet    Sig: Take 1 tablet (10 mg total) by mouth daily with breakfast.    Dispense:  30 tablet    Refill:  0    Taper 4,4,4,3,3,3,2,2,2,1,1,1  Dr. [facility]  08/10/16

## 2016-10-08 ENCOUNTER — Other Ambulatory Visit: Payer: Self-pay

## 2016-10-08 ENCOUNTER — Telehealth: Payer: Self-pay | Admitting: Gastroenterology

## 2016-10-08 DIAGNOSIS — A048 Other specified bacterial intestinal infections: Secondary | ICD-10-CM

## 2016-10-08 NOTE — Telephone Encounter (Signed)
Advised pt she is due for her repeat h pylori test. Orders have been placed. Pt will go tomorrow.

## 2016-10-08 NOTE — Telephone Encounter (Signed)
Patient called and stated that she finished her antibiotic in May and thinks she was told she would need to give another stool sample. Please call her and let her know what to do.

## 2016-11-01 ENCOUNTER — Encounter: Payer: Self-pay | Admitting: Gastroenterology

## 2016-11-01 NOTE — Telephone Encounter (Signed)
ERROR

## 2016-11-02 LAB — H. PYLORI ANTIGEN, STOOL: H PYLORI AG STL: NEGATIVE

## 2016-11-06 ENCOUNTER — Encounter: Payer: Self-pay | Admitting: Family Medicine

## 2016-11-06 ENCOUNTER — Other Ambulatory Visit: Payer: Self-pay | Admitting: Family Medicine

## 2016-11-06 ENCOUNTER — Ambulatory Visit (INDEPENDENT_AMBULATORY_CARE_PROVIDER_SITE_OTHER): Payer: BLUE CROSS/BLUE SHIELD | Admitting: Family Medicine

## 2016-11-06 VITALS — BP 120/64 | HR 64 | Ht 62.0 in | Wt 138.0 lb

## 2016-11-06 DIAGNOSIS — Z124 Encounter for screening for malignant neoplasm of cervix: Secondary | ICD-10-CM | POA: Diagnosis not present

## 2016-11-06 DIAGNOSIS — J301 Allergic rhinitis due to pollen: Secondary | ICD-10-CM | POA: Diagnosis not present

## 2016-11-06 DIAGNOSIS — Z01419 Encounter for gynecological examination (general) (routine) without abnormal findings: Secondary | ICD-10-CM

## 2016-11-06 DIAGNOSIS — K2950 Unspecified chronic gastritis without bleeding: Secondary | ICD-10-CM | POA: Diagnosis not present

## 2016-11-06 LAB — HEMOCCULT GUIAC POC 1CARD (OFFICE): Fecal Occult Blood, POC: NEGATIVE

## 2016-11-06 LAB — POCT URINALYSIS DIPSTICK
Bilirubin, UA: NEGATIVE
GLUCOSE UA: NEGATIVE
KETONES UA: NEGATIVE
Leukocytes, UA: NEGATIVE
Nitrite, UA: NEGATIVE
PROTEIN UA: NEGATIVE
RBC UA: NEGATIVE
SPEC GRAV UA: 1.01 (ref 1.010–1.025)
UROBILINOGEN UA: 0.2 U/dL
pH, UA: 5 (ref 5.0–8.0)

## 2016-11-06 MED ORDER — MONTELUKAST SODIUM 10 MG PO TABS: 10.0000 mg | ORAL_TABLET | Freq: Every day | ORAL | 3 refills | Status: AC

## 2016-11-06 MED ORDER — RANITIDINE HCL 300 MG PO TABS: 300.0000 mg | ORAL_TABLET | Freq: Every day | ORAL | 3 refills | Status: AC

## 2016-11-06 NOTE — Progress Notes (Signed)
Name: Krystal Hardy   MRN: [id number]    DOB: 1980-06-14   Date:11/06/2016       Progress Note  Subjective  Chief Complaint  Chief Complaint  Patient presents with  . Gynecologic Exam    with pap/ has IUD in and needs thyroid checked- been off x 2-3 months    Patient presents for annual physical exam.   Gynecologic Exam  The patient's pertinent negatives include no genital itching, genital lesions, genital odor, genital rash, missed menses, pelvic pain, vaginal bleeding or vaginal discharge. Pertinent negatives include no abdominal pain, anorexia, back pain, chills, constipation, diarrhea, discolored urine, dysuria, fever, flank pain, frequency, headaches, hematuria, joint pain, joint swelling, nausea, painful intercourse, rash, sore throat, urgency or vomiting. Nothing aggravates the symptoms. The treatment provided mild relief. She uses an IUD for contraception. Her menstrual history has been regular. There is no history of endometriosis, menorrhagia, metrorrhagia, miscarriage or perineal abscess.    No problem-specific Assessment & Plan notes found for this encounter.   Past Medical History:  Diagnosis Date  . Allergy   . Anxiety    PT GETS VERY ANXIOUS WHEN SHE HAS ABDOMINAL PAIN-PT STATES SHE GETS SWEATY AND DIZZY-ONCE PAIN RESOLVES, SHE FEELS BETTER  . Asthma    -PT HAS NO INHALERS  . IUD (intrauterine device) in place   . Joint pain   . Medical history non-contributory   . Swelling of elbow joint   . Swelling of knee joint     Past Surgical History:  Procedure Laterality Date  . CHOLECYSTECTOMY N/A 07/21/2015   Procedure: LAPAROSCOPIC CHOLECYSTECTOMY with cholangiogram;  Surgeon: Christene Lye, MD;  Location: [facility] ORS;  Service: General;  Laterality: N/A;  . LAPAROSCOPIC REMOVAL OF MESENTERIC MASS N/A 07/21/2015   Procedure: exploration of lesser curvature of the stomach;  Surgeon: Christene Lye, MD;  Location: [facility] ORS;  Service: General;  Laterality: N/A;  .  LASIK    . NO PAST SURGERIES    . UPPER ESOPHAGEAL ENDOSCOPIC ULTRASOUND (EUS) N/A 03/24/2015   Procedure: UPPER ESOPHAGEAL ENDOSCOPIC ULTRASOUND (EUS);  Surgeon: Jola Schmidt, MD;  Location: [facility] ENDOSCOPY;  Service: Endoscopy;  Laterality: N/A;    Family History  Problem Relation Age of Onset  . Breast cancer Maternal Aunt     Social History   Social History  . Marital status: Married    Spouse name: N/A  . Number of children: N/A  . Years of education: N/A   Occupational History  . Not on file.   Social History Main Topics  . Smoking status: Never Smoker  . Smokeless tobacco: Never Used  . Alcohol use No  . Drug use: No  . Sexual activity: Yes   Other Topics Concern  . Not on file   Social History Narrative  . No narrative on file    Allergies  Allergen Reactions  . Albuterol Swelling    Pt. States face swelled with use of inhaler,  Not sure which one for asthma  . Other Nausea And Vomiting    Epidural anesthesia  . Percocet [Oxycodone-Acetaminophen] Nausea And Vomiting    Outpatient Medications Prior to Visit  Medication Sig Dispense Refill  . cetirizine (ZYRTEC) 10 MG tablet Take 10 mg by mouth daily.    . fluticasone (FLONASE) 50 MCG/ACT nasal spray Place 1 spray into both nostrils daily.  0  . levonorgestrel (MIRENA) 20 MCG/24HR IUD 1 each by Intrauterine route once.    . montelukast (SINGULAIR) 10 MG tablet Take  1 tablet (10 mg total) by mouth at bedtime. 30 tablet 3  . levothyroxine (SYNTHROID, LEVOTHROID) 25 MCG tablet Take 1 tablet (25 mcg total) by mouth daily before breakfast. (Patient not taking: Reported on 11/06/2016) 90 tablet 1  . esomeprazole (NEXIUM) 20 MG capsule Take by mouth.    . predniSONE (DELTASONE) 10 MG tablet Take 1 tablet (10 mg total) by mouth daily with breakfast. 30 tablet 0   No facility-administered medications prior to visit.     Review of Systems  Constitutional: Negative for chills, fever, malaise/fatigue and weight loss.   HENT: Negative for ear discharge, ear pain and sore throat.   Eyes: Negative for blurred vision.  Respiratory: Negative for cough, sputum production, shortness of breath and wheezing.   Cardiovascular: Negative for chest pain, palpitations and leg swelling.  Gastrointestinal: Negative for abdominal pain, anorexia, blood in stool, constipation, diarrhea, heartburn, melena, nausea and vomiting.  Genitourinary: Negative for dysuria, flank pain, frequency, hematuria, menorrhagia, missed menses, pelvic pain, urgency and vaginal discharge.  Musculoskeletal: Negative for back pain, joint pain, myalgias and neck pain.  Skin: Negative for rash.  Neurological: Negative for dizziness, tingling, sensory change, focal weakness and headaches.  Endo/Heme/Allergies: Negative for environmental allergies and polydipsia. Does not bruise/bleed easily.  Psychiatric/Behavioral: Negative for depression and suicidal ideas. The patient is not nervous/anxious and does not have insomnia.      Objective  Vitals:   11/06/16 0809  BP: 120/64  Pulse: 64  Weight: 138 lb (62.6 kg)  Height: 5\' 2"  (1.575 m)    Physical Exam  Constitutional: She is oriented to person, place, and time and well-developed, well-nourished, and in no distress. Vital signs are normal. No distress.  HENT:  Head: Normocephalic and atraumatic.  Right Ear: Tympanic membrane, external ear and ear canal normal.  Left Ear: Tympanic membrane, external ear and ear canal normal.  Nose: Nose normal.  Mouth/Throat: Uvula is midline, oropharynx is clear and moist and mucous membranes are normal.  Eyes: Pupils are equal, round, and reactive to light. Conjunctivae, EOM and lids are normal. Right eye exhibits no discharge. Left eye exhibits no discharge.  Fundoscopic exam:      The right eye shows no arteriolar narrowing and no AV nicking.       The left eye shows no arteriolar narrowing and no AV nicking.  Neck: Trachea normal and normal range of  motion. Neck supple. Normal carotid pulses, no hepatojugular reflux and no JVD present. Carotid bruit is not present. No thyroid mass and no thyromegaly present.  Cardiovascular: Normal rate, regular rhythm, S1 normal, S2 normal, normal heart sounds, intact distal pulses and normal pulses.  PMI is not displaced.  Exam reveals no gallop and no friction rub.   No murmur heard. Pulmonary/Chest: Effort normal and breath sounds normal. No respiratory distress. She has no decreased breath sounds. She has no wheezes. She has no rhonchi. She has no rales. She exhibits no tenderness. Right breast exhibits no inverted nipple, no mass, no nipple discharge, no skin change and no tenderness. Left breast exhibits no inverted nipple, no mass, no nipple discharge, no skin change and no tenderness. Breasts are symmetrical.  Abdominal: Soft. Normal appearance, normal aorta and bowel sounds are normal. She exhibits no mass. There is no hepatosplenomegaly. There is no tenderness. There is no guarding and no CVA tenderness.  Genitourinary: Rectum normal, vagina normal, uterus normal, cervix normal, right adnexa normal, left adnexa normal and vulva normal. Rectal exam shows guaiac negative stool.  Genitourinary Comments: iud string noted  Musculoskeletal: Normal range of motion. She exhibits no edema.  Lymphadenopathy:       Head (right side): No submental and no submandibular adenopathy present.       Head (left side): No submental and no submandibular adenopathy present.    She has no cervical adenopathy.  Neurological: She is alert and oriented to person, place, and time. She has normal sensation, normal strength, normal reflexes and intact cranial nerves.  Skin: Skin is warm and dry. No rash noted. She is not diaphoretic.  Psychiatric: Mood and affect normal.  Nursing note and vitals reviewed.     Assessment & Plan  Problem List Items Addressed This Visit    None    Visit Diagnoses    Well female exam with  routine gynecological exam    -  Primary   Relevant Orders   Comprehensive metabolic panel   CBC with Differential/Platelet   TSH   H. pylori antibody, IgG   POCT occult blood stool (Completed)   POCT urinalysis dipstick (Completed)   Pap IG and HPV (high risk) DNA detection   Seasonal allergic rhinitis due to pollen       Relevant Medications   montelukast (SINGULAIR) 10 MG tablet   Chronic gastritis without bleeding, unspecified gastritis type       Relevant Medications   ranitidine (ZANTAC) 300 MG tablet   Cervical cancer screening       Relevant Orders   Pap IG and HPV (high risk) DNA detection      Meds ordered this encounter  Medications  . montelukast (SINGULAIR) 10 MG tablet    Sig: Take 1 tablet (10 mg total) by mouth at bedtime.    Dispense:  30 tablet    Refill:  3  . ranitidine (ZANTAC) 300 MG tablet    Sig: Take 1 tablet (300 mg total) by mouth at bedtime.    Dispense:  90 tablet    Refill:  3      Dr. Otilio Miu [facility]  11/06/16

## 2016-11-07 LAB — COMPREHENSIVE METABOLIC PANEL
A/G RATIO: 1.4 (ref 1.2–2.2)
ALT: 31 IU/L (ref 0–32)
AST: 25 IU/L (ref 0–40)
Albumin: 4.1 g/dL (ref 3.5–5.5)
Alkaline Phosphatase: 53 IU/L (ref 39–117)
BILIRUBIN TOTAL: 0.4 mg/dL (ref 0.0–1.2)
BUN/Creatinine Ratio: 20 (ref 9–23)
BUN: 11 mg/dL (ref 6–20)
CALCIUM: 9.1 mg/dL (ref 8.7–10.2)
CO2: 21 mmol/L (ref 20–29)
Chloride: 105 mmol/L (ref 96–106)
Creatinine, Ser: 0.55 mg/dL — ABNORMAL LOW (ref 0.57–1.00)
GFR calc Af Amer: 140 mL/min/{1.73_m2} (ref >59)
GFR calc non Af Amer: 121 mL/min/{1.73_m2} (ref >59)
Globulin, Total: 2.9 g/dL (ref 1.5–4.5)
Glucose: 80 mg/dL (ref 65–99)
POTASSIUM: 3.9 mmol/L (ref 3.5–5.2)
Sodium: 140 mmol/L (ref 134–144)
Total Protein: 7 g/dL (ref 6.0–8.5)

## 2016-11-07 LAB — CBC WITH DIFFERENTIAL/PLATELET
Basophils Absolute: 0 10*3/uL (ref 0.0–0.2)
Basos: 0 %
EOS (ABSOLUTE): 0.1 10*3/uL (ref 0.0–0.4)
Eos: 2 %
HEMOGLOBIN: 12.4 g/dL (ref 11.1–15.9)
Hematocrit: 37.3 % (ref 34.0–46.6)
IMMATURE GRANS (ABS): 0 10*3/uL (ref 0.0–0.1)
IMMATURE GRANULOCYTES: 0 %
LYMPHS: 34 %
Lymphocytes Absolute: 1.3 10*3/uL (ref 0.7–3.1)
MCH: 30 pg (ref 26.6–33.0)
MCHC: 33.2 g/dL (ref 31.5–35.7)
MCV: 90 fL (ref 79–97)
Monocytes Absolute: 0.4 10*3/uL (ref 0.1–0.9)
Monocytes: 11 %
NEUTROS PCT: 53 %
Neutrophils Absolute: 2.1 10*3/uL (ref 1.4–7.0)
PLATELETS: 304 10*3/uL (ref 150–379)
RBC: 4.14 x10E6/uL (ref 3.77–5.28)
RDW: 13.2 % (ref 12.3–15.4)
WBC: 3.9 10*3/uL (ref 3.4–10.8)

## 2016-11-07 LAB — H. PYLORI ANTIBODY, IGG: H. pylori, IgG AbS: 2.11 Index Value — ABNORMAL HIGH (ref 0.00–0.79)

## 2016-11-07 LAB — TSH: TSH: 6.06 u[IU]/mL — AB (ref 0.450–4.500)

## 2016-11-08 ENCOUNTER — Telehealth: Payer: Self-pay | Admitting: Gastroenterology

## 2016-11-08 ENCOUNTER — Other Ambulatory Visit: Payer: Self-pay

## 2016-11-08 DIAGNOSIS — E039 Hypothyroidism, unspecified: Secondary | ICD-10-CM

## 2016-11-08 LAB — PAP IG AND HPV HIGH-RISK
HPV, HIGH-RISK: NEGATIVE
PAP Smear Comment: 0

## 2016-11-08 LAB — PLEASE NOTE

## 2016-11-08 MED ORDER — LEVOTHYROXINE SODIUM 25 MCG PO TABS: 25.0000 ug | ORAL_TABLET | Freq: Every day | ORAL | 1 refills | Status: DC

## 2016-11-08 NOTE — Telephone Encounter (Signed)
Tara from Dr. Ronnald Ramp office called and stated that he did a recheck on H Pylori and it was 2.11. Per Baxter Flattery please call patient to let her know where she goes from here.

## 2016-11-09 NOTE — Telephone Encounter (Signed)
Pt's has already been rechecked for her H pylori. Test results are in Dr. Dorothey Baseman result box waiting for review. Pt will be contacted Monday upon his return.

## 2016-11-12 ENCOUNTER — Telehealth: Payer: Self-pay

## 2016-11-12 NOTE — Telephone Encounter (Signed)
-----   Message from Lucilla Lame, MD sent at 11/12/2016  6:47 AM EDT ----- Let the patient know her H. Pylori was negative.

## 2016-11-12 NOTE — Telephone Encounter (Signed)
Pt notified of lab results

## 2016-11-30 ENCOUNTER — Other Ambulatory Visit: Payer: Self-pay

## 2016-12-13 ENCOUNTER — Other Ambulatory Visit: Payer: Self-pay

## 2016-12-24 ENCOUNTER — Telehealth: Payer: Self-pay | Admitting: Gastroenterology

## 2016-12-24 NOTE — Telephone Encounter (Signed)
Patient left a voice message that Dr. Ronnald Ramp told her that the H. Pylori was high and needs to know what to do. Please call

## 2016-12-25 NOTE — Telephone Encounter (Signed)
Please review labs that were done in July by Dr. Ronnald Ramp for H pylori. We also checked it via stool in July and it was negative.

## 2016-12-25 NOTE — Telephone Encounter (Signed)
Pt notified of Dr. Dorothey Baseman message regarding H pylori.

## 2016-12-25 NOTE — Telephone Encounter (Signed)
Please call patient

## 2016-12-25 NOTE — Telephone Encounter (Signed)
Ginger please let this patient know that her H. Pylori antibody may be high for up to 18 months after being eradicated from her stomach. What is important is that her stool test was negative and she does not need any further treatment at this time for her H. pylori.

## 2017-01-03 ENCOUNTER — Other Ambulatory Visit: Payer: BLUE CROSS/BLUE SHIELD

## 2017-01-03 DIAGNOSIS — E039 Hypothyroidism, unspecified: Secondary | ICD-10-CM

## 2017-01-04 ENCOUNTER — Other Ambulatory Visit: Payer: Self-pay

## 2017-01-04 DIAGNOSIS — E039 Hypothyroidism, unspecified: Secondary | ICD-10-CM

## 2017-01-04 LAB — TSH: TSH: 2.15 u[IU]/mL (ref 0.450–4.500)

## 2017-01-04 MED ORDER — LEVOTHYROXINE SODIUM 25 MCG PO TABS: 25.0000 ug | ORAL_TABLET | Freq: Every day | ORAL | 5 refills | Status: AC

## 2017-02-22 ENCOUNTER — Other Ambulatory Visit: Payer: Self-pay | Admitting: Family Medicine

## 2017-02-25 ENCOUNTER — Ambulatory Visit: Payer: BLUE CROSS/BLUE SHIELD | Admitting: Family Medicine

## 2017-02-25 ENCOUNTER — Encounter: Payer: Self-pay | Admitting: Family Medicine

## 2017-02-25 VITALS — BP 110/64 | HR 80 | Ht 62.0 in | Wt 138.0 lb

## 2017-02-25 DIAGNOSIS — M940 Chondrocostal junction syndrome [Tietze]: Secondary | ICD-10-CM

## 2017-02-25 DIAGNOSIS — J0100 Acute maxillary sinusitis, unspecified: Secondary | ICD-10-CM

## 2017-02-25 DIAGNOSIS — J40 Bronchitis, not specified as acute or chronic: Secondary | ICD-10-CM | POA: Diagnosis not present

## 2017-02-25 MED ORDER — GUAIFENESIN-CODEINE 100-10 MG/5ML PO SYRP: 5.0000 mL | ORAL_SOLUTION | Freq: Three times a day (TID) | ORAL | 0 refills | Status: DC | PRN

## 2017-02-25 MED ORDER — AMOXICILLIN 500 MG PO CAPS: 500.0000 mg | ORAL_CAPSULE | Freq: Three times a day (TID) | ORAL | 0 refills | Status: DC

## 2017-02-25 NOTE — Progress Notes (Signed)
Name: Krystal Hardy   MRN: [id number]    DOB: 1980/08/31   Date:02/25/2017       Progress Note  Subjective  Chief Complaint  Chief Complaint  Patient presents with  . Sinusitis    cough and cong    Sinusitis  This is a new problem. The current episode started in the past 7 days. The problem has been gradually worsening since onset. There has been no fever. The pain is moderate. Associated symptoms include congestion, coughing, ear pain, headaches, sinus pressure, sneezing and a sore throat. Pertinent negatives include no chills, diaphoresis, hoarse voice, neck pain or shortness of breath. Past treatments include oral decongestants. The treatment provided no relief.    No problem-specific Assessment & Plan notes found for this encounter.   Past Medical History:  Diagnosis Date  . Allergy   . Anxiety    PT GETS VERY ANXIOUS WHEN SHE HAS ABDOMINAL PAIN-PT STATES SHE GETS SWEATY AND DIZZY-ONCE PAIN RESOLVES, SHE FEELS BETTER  . Asthma    -PT HAS NO INHALERS  . IUD (intrauterine device) in place   . Joint pain   . Medical history non-contributory   . Swelling of elbow joint   . Swelling of knee joint     Past Surgical History:  Procedure Laterality Date  . exploration of lesser curvature of the stomach N/A 07/21/2015   Performed by Christene Lye, MD at [facility] ORS  . LAPAROSCOPIC CHOLECYSTECTOMY with cholangiogram N/A 07/21/2015   Performed by Christene Lye, MD at [facility] ORS  . LASIK    . NO PAST SURGERIES    . UPPER ESOPHAGEAL ENDOSCOPIC ULTRASOUND (EUS) N/A 03/24/2015   Performed by Jola Schmidt, MD at [facility] ENDOSCOPY    Family History  Problem Relation Age of Onset  . Breast cancer Maternal Aunt     Social History   Socioeconomic History  . Marital status: Married    Spouse name: Not on file  . Number of children: Not on file  . Years of education: Not on file  . Highest education level: Not on file  Social Needs  . Financial resource strain: Not on file    . Food insecurity - worry: Not on file  . Food insecurity - inability: Not on file  . Transportation needs - medical: Not on file  . Transportation needs - non-medical: Not on file  Occupational History  . Not on file  Tobacco Use  . Smoking status: Never Smoker  . Smokeless tobacco: Never Used  Substance and Sexual Activity  . Alcohol use: No    Alcohol/week: 0.0 oz  . Drug use: No  . Sexual activity: Yes  Other Topics Concern  . Not on file  Social History Narrative  . Not on file    Allergies  Allergen Reactions  . Albuterol Swelling    Pt. States face swelled with use of inhaler,  Not sure which one for asthma  . Other Nausea And Vomiting    Epidural anesthesia  . Percocet [Oxycodone-Acetaminophen] Nausea And Vomiting    Outpatient Medications Prior to Visit  Medication Sig Dispense Refill  . fluticasone (FLONASE) 50 MCG/ACT nasal spray INSTILL 1 SPRAY INTO EACH NOSTRIL ONCE ADAY 16 g 11  . levonorgestrel (MIRENA) 20 MCG/24HR IUD 1 each by Intrauterine route once.    Marland Kitchen levothyroxine (SYNTHROID, LEVOTHROID) 25 MCG tablet Take 1 tablet (25 mcg total) by mouth daily before breakfast. 30 tablet 5  . montelukast (SINGULAIR) 10 MG tablet Take 1  tablet (10 mg total) by mouth at bedtime. 30 tablet 3  . ranitidine (ZANTAC) 300 MG tablet Take 1 tablet (300 mg total) by mouth at bedtime. 90 tablet 3  . cetirizine (ZYRTEC) 10 MG tablet Take 10 mg by mouth daily.     No facility-administered medications prior to visit.     Review of Systems  Constitutional: Negative for chills, diaphoresis, fever, malaise/fatigue and weight loss.  HENT: Positive for congestion, ear pain, sinus pressure, sneezing and sore throat. Negative for ear discharge and hoarse voice.   Eyes: Negative for blurred vision.  Respiratory: Positive for cough. Negative for sputum production, shortness of breath and wheezing.   Cardiovascular: Negative for chest pain, palpitations and leg swelling.   Gastrointestinal: Negative for abdominal pain, blood in stool, constipation, diarrhea, heartburn, melena and nausea.  Genitourinary: Negative for dysuria, frequency, hematuria and urgency.  Musculoskeletal: Negative for back pain, joint pain, myalgias and neck pain.  Skin: Negative for rash.  Neurological: Positive for headaches. Negative for dizziness, tingling, sensory change and focal weakness.  Endo/Heme/Allergies: Negative for environmental allergies and polydipsia. Does not bruise/bleed easily.  Psychiatric/Behavioral: Negative for depression and suicidal ideas. The patient is not nervous/anxious and does not have insomnia.      Objective  Vitals:   02/25/17 0956  BP: 110/64  Pulse: 80  Weight: 138 lb (62.6 kg)  Height: 5\' 2"  (1.575 m)    Physical Exam  Constitutional: She is well-developed, well-nourished, and in no distress. No distress.  HENT:  Head: Normocephalic and atraumatic.  Right Ear: Tympanic membrane and external ear normal.  Left Ear: Tympanic membrane and external ear normal.  Nose: Right sinus exhibits maxillary sinus tenderness. Left sinus exhibits maxillary sinus tenderness.  Mouth/Throat: Oropharynx is clear and moist.  Eyes: Conjunctivae and EOM are normal. Pupils are equal, round, and reactive to light. Right eye exhibits no discharge. Left eye exhibits no discharge.  Neck: Normal range of motion. Neck supple. No JVD present. No thyromegaly present.  Cardiovascular: Normal rate, regular rhythm, normal heart sounds and intact distal pulses. Exam reveals no gallop and no friction rub.  No murmur heard. Pulmonary/Chest: Effort normal and breath sounds normal. She has no wheezes. She has no rales. She exhibits tenderness.    Abdominal: Soft. Bowel sounds are normal. She exhibits no mass. There is no hepatosplenomegaly. There is no tenderness. There is no rebound and no guarding.  Musculoskeletal: Normal range of motion. She exhibits no edema.   Lymphadenopathy:    She has no cervical adenopathy.  Neurological: She is alert. She has normal reflexes.  Skin: Skin is warm and dry. She is not diaphoretic.  Psychiatric: Mood and affect normal.  Nursing note and vitals reviewed.     Assessment & Plan  Problem List Items Addressed This Visit    None    Visit Diagnoses    Acute maxillary sinusitis, recurrence not specified    -  Primary   Relevant Medications   amoxicillin (AMOXIL) 500 MG capsule   guaiFENesin-codeine (ROBITUSSIN AC) 100-10 MG/5ML syrup   Bronchitis       Relevant Medications   amoxicillin (AMOXIL) 500 MG capsule   guaiFENesin-codeine (ROBITUSSIN AC) 100-10 MG/5ML syrup   Costochondritis       aleve      Meds ordered this encounter  Medications  . amoxicillin (AMOXIL) 500 MG capsule    Sig: Take 1 capsule (500 mg total) 3 (three) times daily by mouth.    Dispense:  30 capsule  Refill:  0  . guaiFENesin-codeine (ROBITUSSIN AC) 100-10 MG/5ML syrup    Sig: Take 5 mLs 3 (three) times daily as needed by mouth for cough.    Dispense:  100 mL    Refill:  0      Dr. Otilio Miu [facility] [place]  02/25/17

## 2017-03-20 ENCOUNTER — Ambulatory Visit
Admission: RE | Admit: 2017-03-20 | Discharge: 2017-03-20 | Disposition: A | Discharge status: Home / Self Care | Payer: BLUE CROSS/BLUE SHIELD | Source: Ambulatory Visit | Attending: Family Medicine | Admitting: Family Medicine

## 2017-03-20 ENCOUNTER — Ambulatory Visit: Payer: BLUE CROSS/BLUE SHIELD | Admitting: Family Medicine

## 2017-03-20 ENCOUNTER — Encounter: Payer: Self-pay | Admitting: Family Medicine

## 2017-03-20 VITALS — BP 100/80 | HR 64 | Ht 62.0 in | Wt 138.0 lb

## 2017-03-20 DIAGNOSIS — J209 Acute bronchitis, unspecified: Secondary | ICD-10-CM

## 2017-03-20 IMAGING — US US ABDOMEN LIMITED
1 series · 14 of 25 positions shown · non-contrast
Comparison: Limited abdominal ultrasound April 07, 2015

CLINICAL DATA: Intermittent right upper quadrant pain for the past
5 months ; known gallstones

EXAM:
US ABDOMEN LIMITED - RIGHT UPPER QUADRANT

[Series 1: us abdomen limited · 0.19mm/px · 14 of 47 slices shown]
[im 1/47]
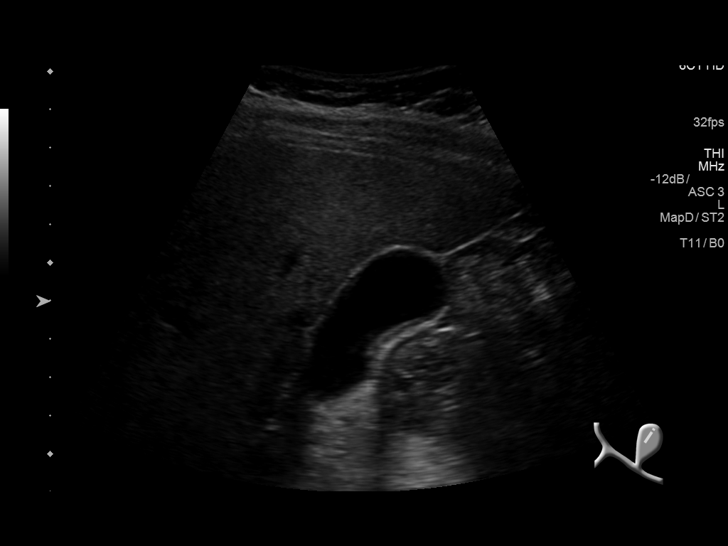
[im 4/47]
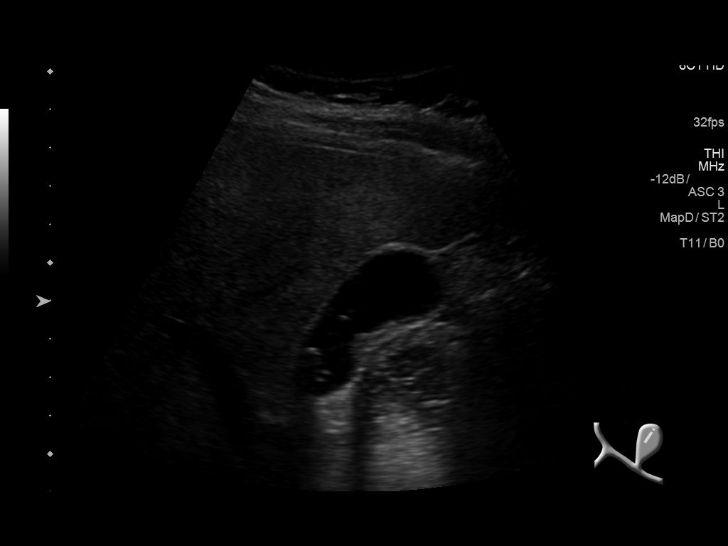
[im 8/47]
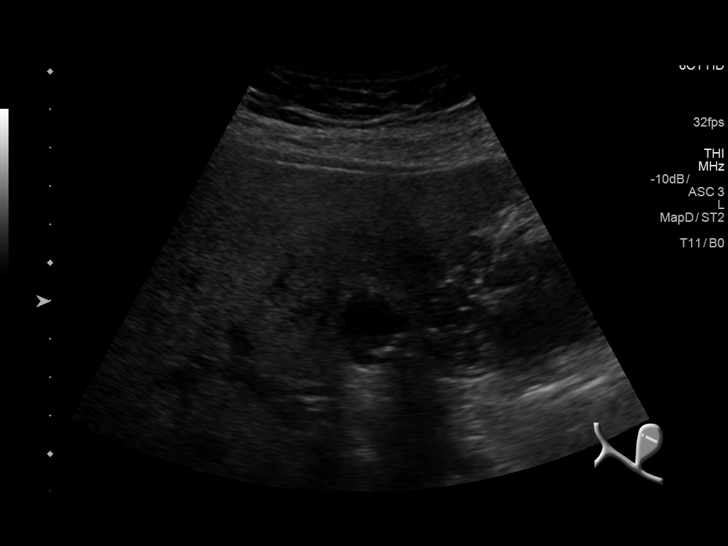
[im 12/47]
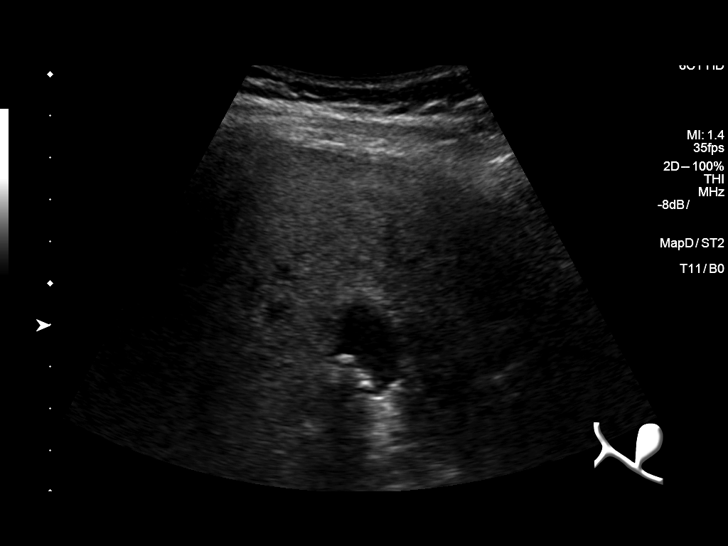
[im 16/47]
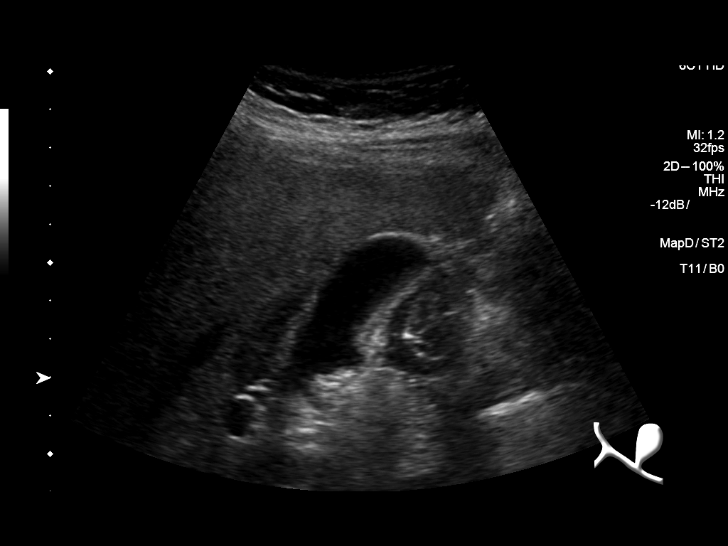
[im 18/47]
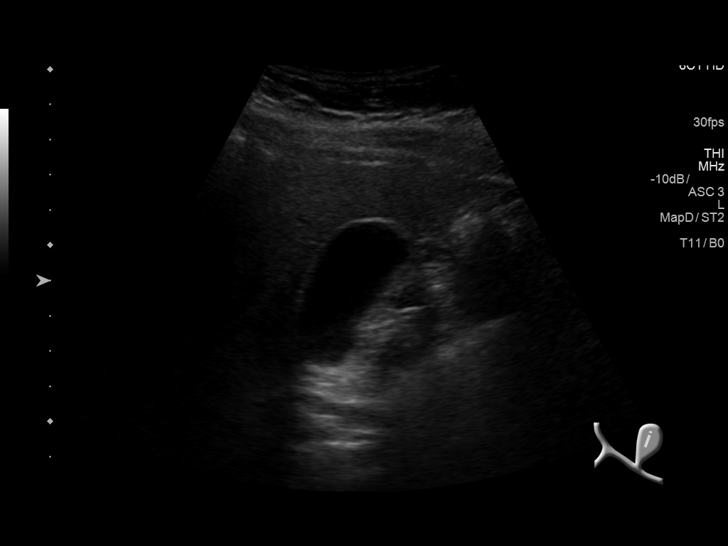
[im 22/47]
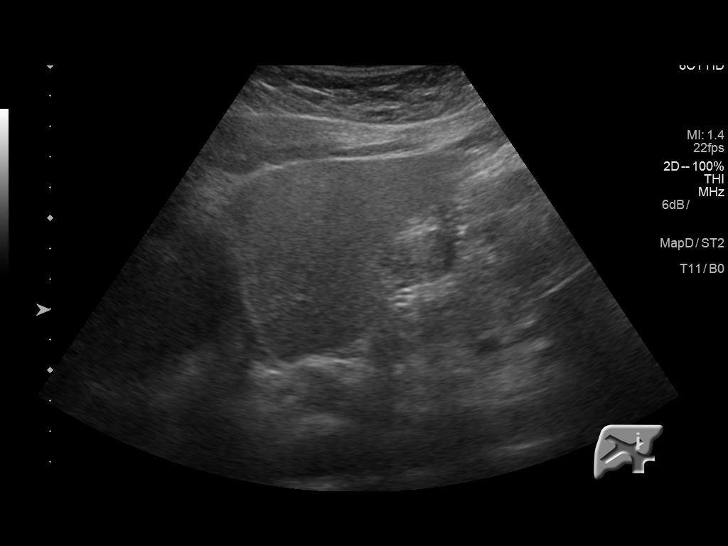
[im 25/47]
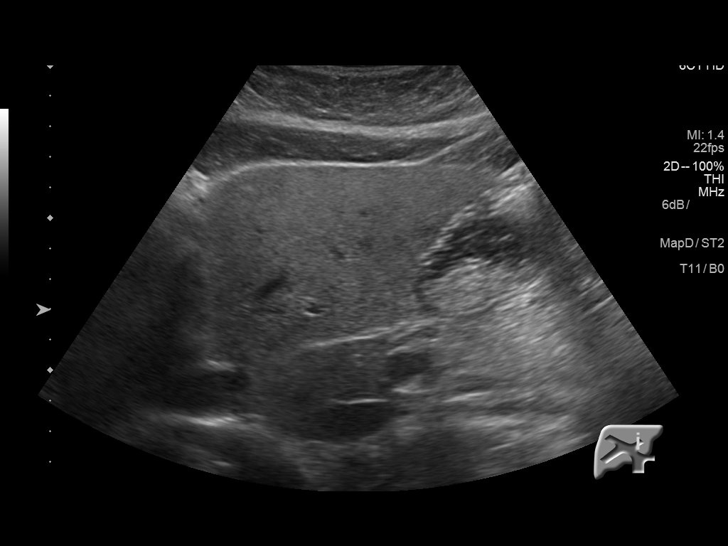
[im 29/47]
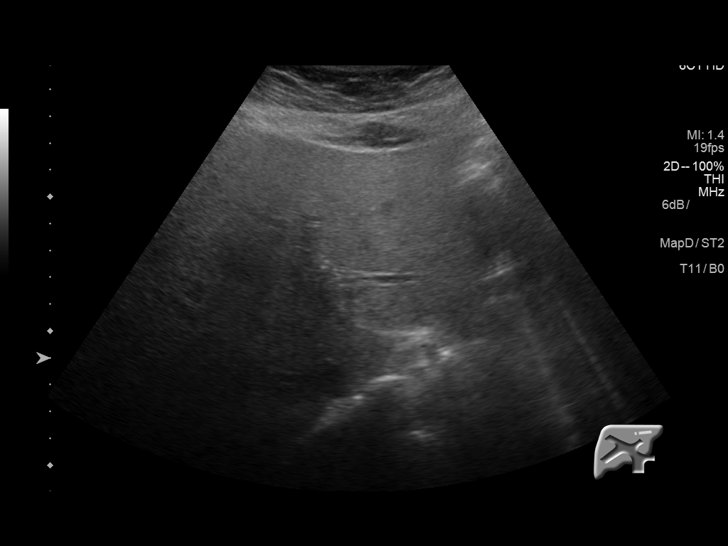
[im 31/47]
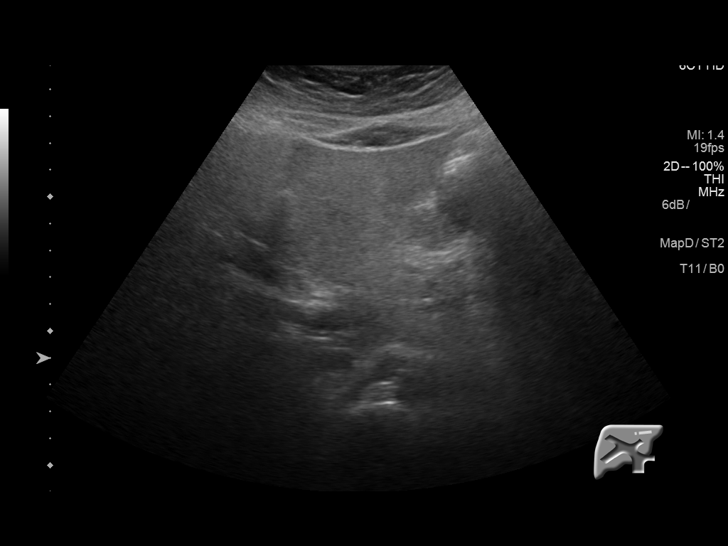
[im 35/47]
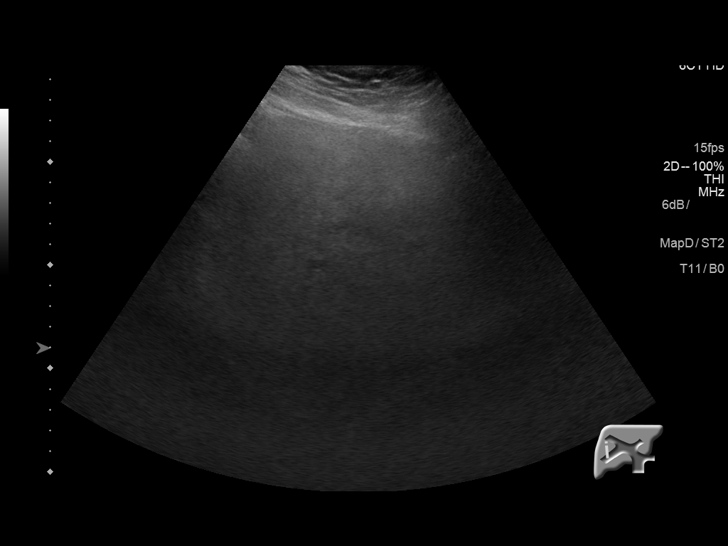
[im 39/47]
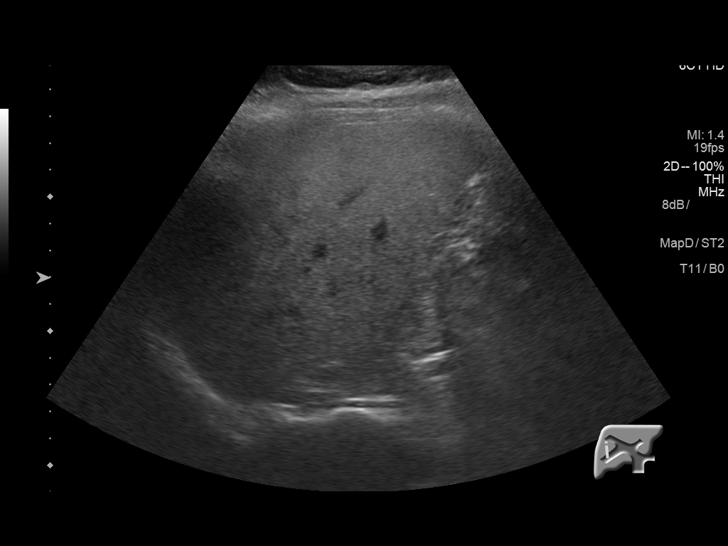
[im 43/47]
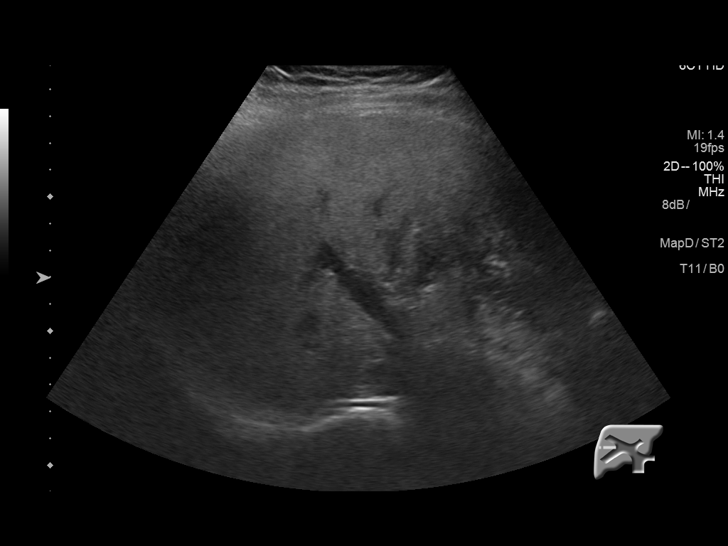
[im 47/47]
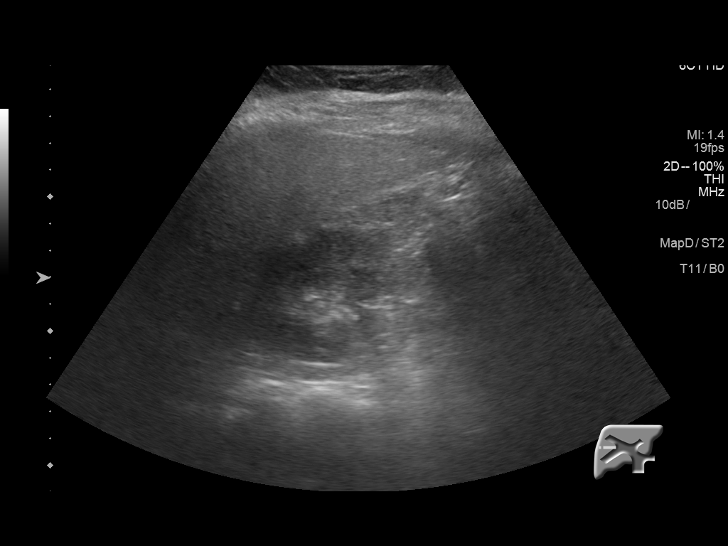

[14 of 25 positions shown; findings below may reference images not displayed]

FINDINGS: Gallbladder:

The gallbladder is adequately distended. There are multiple
echogenic mobile shadowing stones. The largest measures 5 mm in
diameter. There is no gallbladder wall thickening, pericholecystic
fluid, or positive sonographic Murphy's sign.

Common bile duct:

Diameter: 6.5 mm which is at the upper limits of normal. This is
stable.

Liver:

The hepatic echotexture is mildly increased. There is no area of
decreased echogenicity adjacent to the gallbladder fossa compatible
with focal fatty sparing. There is no intrahepatic ductal dilation.
The surface contour of the liver is normal.
IMPRESSION: 1. Gallstones without sonographic evidence of acute cholecystitis.
2. Increased hepatic echotexture consistent with fatty infiltration.

## 2017-03-20 MED ORDER — AZITHROMYCIN 250 MG PO TABS: ORAL_TABLET | ORAL | 0 refills | Status: AC

## 2017-03-20 MED ORDER — GUAIFENESIN-CODEINE 100-10 MG/5ML PO SYRP: 5.0000 mL | ORAL_SOLUTION | Freq: Three times a day (TID) | ORAL | 0 refills | Status: AC | PRN

## 2017-03-20 NOTE — Progress Notes (Signed)
Name: Manpreet Kemmer   MRN: [id number]    DOB: 12/05/1980   Date:03/20/2017       Progress Note  Subjective  Chief Complaint  Chief Complaint  Patient presents with  . Sinusitis    cough, runny nose, cong, sore throat- finished Amoxil on 03/07/17    Sinusitis  This is a recurrent problem. The current episode started in the past 7 days. The problem has been gradually worsening since onset. There has been no fever. Her pain is at a severity of 6/10. The pain is moderate. Pertinent negatives include no chills, congestion, coughing, diaphoresis, ear pain, headaches, hoarse voice, neck pain, shortness of breath, sinus pressure, sneezing, sore throat or swollen glands. Past treatments include antibiotics. The treatment provided moderate relief.    No problem-specific Assessment & Plan notes found for this encounter.   Past Medical History:  Diagnosis Date  . Allergy   . Anxiety    PT GETS VERY ANXIOUS WHEN SHE HAS ABDOMINAL PAIN-PT STATES SHE GETS SWEATY AND DIZZY-ONCE PAIN RESOLVES, SHE FEELS BETTER  . Asthma    -PT HAS NO INHALERS  . IUD (intrauterine device) in place   . Joint pain   . Medical history non-contributory   . Swelling of elbow joint   . Swelling of knee joint     Past Surgical History:  Procedure Laterality Date  . CHOLECYSTECTOMY N/A 07/21/2015   Procedure: LAPAROSCOPIC CHOLECYSTECTOMY with cholangiogram;  Surgeon: Christene Lye, MD;  Location: [facility] ORS;  Service: General;  Laterality: N/A;  . LAPAROSCOPIC REMOVAL OF MESENTERIC MASS N/A 07/21/2015   Procedure: exploration of lesser curvature of the stomach;  Surgeon: Christene Lye, MD;  Location: [facility] OR[facility];  Service: General;  Laterality: N/A;  . LASIK    . NO PAST SURGERIES    . UPPER ESOPHAGEAL ENDOSCOPIC ULTRASOUND (EUS) N/A 03/24/2015   Procedure: UPPER ESOPHAGEAL ENDOSCOPIC ULTRASOUND (EUS);  Surgeon: Jola Schmidt, MD;  Location: [facility] ENDOSCOPY;  Service: Endoscopy;  Laterality: N/A;    Family  History  Problem Relation Age of Onset  . Breast cancer Maternal Aunt     Social History   Socioeconomic History  . Marital status: Married    Spouse name: Not on file  . Number of children: Not on file  . Years of education: Not on file  . Highest education level: Not on file  Social Needs  . Financial resource strain: Not on file  . Food insecurity - worry: Not on file  . Food insecurity - inability: Not on file  . Transportation needs - medical: Not on file  . Transportation needs - non-medical: Not on file  Occupational History  . Not on file  Tobacco Use  . Smoking status: Never Smoker  . Smokeless tobacco: Never Used  Substance and Sexual Activity  . Alcohol use: No    Alcohol/week: 0.0 oz  . Drug use: No  . Sexual activity: Yes  Other Topics Concern  . Not on file  Social History Narrative  . Not on file    Allergies  Allergen Reactions  . Albuterol Swelling    Pt. States face swelled with use of inhaler,  Not sure which one for asthma  . Other Nausea And Vomiting    Epidural anesthesia  . Percocet [Oxycodone-Acetaminophen] Nausea And Vomiting    Outpatient Medications Prior to Visit  Medication Sig Dispense Refill  . fluticasone (FLONASE) 50 MCG/ACT nasal spray INSTILL 1 SPRAY INTO EACH NOSTRIL ONCE ADAY 16 g 11  .  levonorgestrel (MIRENA) 20 MCG/24HR IUD 1 each by Intrauterine route once.    Marland Kitchen levothyroxine (SYNTHROID, LEVOTHROID) 25 MCG tablet Take 1 tablet (25 mcg total) by mouth daily before breakfast. 30 tablet 5  . montelukast (SINGULAIR) 10 MG tablet Take 1 tablet (10 mg total) by mouth at bedtime. 30 tablet 3  . ranitidine (ZANTAC) 300 MG tablet Take 1 tablet (300 mg total) by mouth at bedtime. 90 tablet 3  . amoxicillin (AMOXIL) 500 MG capsule Take 1 capsule (500 mg total) 3 (three) times daily by mouth. 30 capsule 0  . guaiFENesin-codeine (ROBITUSSIN AC) 100-10 MG/5ML syrup Take 5 mLs 3 (three) times daily as needed by mouth for cough. 100 mL 0    No facility-administered medications prior to visit.     Review of Systems  Constitutional: Negative for chills, diaphoresis, fever, malaise/fatigue and weight loss.  HENT: Negative for congestion, ear discharge, ear pain, hoarse voice, sinus pressure, sneezing and sore throat.   Eyes: Negative for blurred vision.  Respiratory: Negative for cough, sputum production, shortness of breath and wheezing.   Cardiovascular: Negative for chest pain, palpitations and leg swelling.  Gastrointestinal: Negative for abdominal pain, blood in stool, constipation, diarrhea, heartburn, melena and nausea.  Genitourinary: Negative for dysuria, frequency, hematuria and urgency.  Musculoskeletal: Negative for back pain, joint pain, myalgias and neck pain.  Skin: Negative for rash.  Neurological: Negative for dizziness, tingling, sensory change, focal weakness and headaches.  Endo/Heme/Allergies: Negative for environmental allergies and polydipsia. Does not bruise/bleed easily.  Psychiatric/Behavioral: Negative for depression and suicidal ideas. The patient is not nervous/anxious and does not have insomnia.      Objective  Vitals:   03/20/17 1012  BP: 100/80  Pulse: 64  Weight: 138 lb (62.6 kg)  Height: 5\' 2"  (1.575 m)    Physical Exam  Constitutional: She is well-developed, well-nourished, and in no distress. No distress.  HENT:  Head: Normocephalic and atraumatic.  Right Ear: External ear normal.  Left Ear: External ear normal.  Nose: Nose normal.  Mouth/Throat: Oropharynx is clear and moist.  Eyes: Conjunctivae and EOM are normal. Pupils are equal, round, and reactive to light. Right eye exhibits no discharge. Left eye exhibits no discharge.  Neck: Normal range of motion. Neck supple. No JVD present. No thyromegaly present.  Cardiovascular: Normal rate, regular rhythm, normal heart sounds and intact distal pulses. Exam reveals no gallop and no friction rub.  No murmur  heard. Pulmonary/Chest: Effort normal and breath sounds normal. She has no wheezes. She has no rales.  Abdominal: Soft. Bowel sounds are normal. She exhibits no mass. There is no tenderness. There is no guarding.  Musculoskeletal: Normal range of motion. She exhibits no edema.  Lymphadenopathy:    She has no cervical adenopathy.  Neurological: She is alert. She has normal reflexes.  Skin: Skin is warm and dry. She is not diaphoretic.  Psychiatric: Mood and affect normal.  Nursing note and vitals reviewed.     Assessment & Plan  Problem List Items Addressed This Visit    None    Visit Diagnoses    Acute bronchitis, unspecified organism    -  Primary   Relevant Medications   azithromycin (ZITHROMAX) 250 MG tablet   guaiFENesin-codeine (ROBITUSSIN AC) 100-10 MG/5ML syrup   Other Relevant Orders   DG Chest 2 View      Meds ordered this encounter  Medications  . azithromycin (ZITHROMAX) 250 MG tablet    Sig: 2 today then 1 a day  for 4 days    Dispense:  6 tablet    Refill:  0  . guaiFENesin-codeine (ROBITUSSIN AC) 100-10 MG/5ML syrup    Sig: Take 5 mLs by mouth 3 (three) times daily as needed for cough.    Dispense:  100 mL    Refill:  0      Dr. Otilio Miu [facility]  03/20/17

## 2017-03-22 ENCOUNTER — Telehealth: Payer: Self-pay

## 2017-03-22 ENCOUNTER — Other Ambulatory Visit: Payer: Self-pay

## 2017-03-22 NOTE — Telephone Encounter (Signed)
Patient came by for image results. Printed and advised they look normal but to call Chassidy later with concerns or wait for Chassidy to call her back. She said she had a missed call from her and we advised she is with patients at this time.

## 2017-03-25 ENCOUNTER — Other Ambulatory Visit: Payer: Self-pay | Admitting: Family Medicine

## 2017-03-25 DIAGNOSIS — J301 Allergic rhinitis due to pollen: Secondary | ICD-10-CM

## 2018-01-26 IMAGING — CT CT ABD-PELV W/ CM
2 of 4 series · 16 of 46 positions shown, 18 images · IV contrast (iopamidol)
Comparison: 09/16/2015 and 07/05/2015

CLINICAL DATA: Persistent left upper quadrant pain. Followup
mesenteric mass and lymphadenopathy.

EXAM:
CT ABDOMEN AND PELVIS WITH CONTRAST
TECHNIQUE: Multidetector CT imaging of the abdomen and pelvis was performed
using the standard protocol following bolus administration of
intravenous contrast.
CONTRAST:  100mL [id number] IOPAMIDOL ([id number]) INJECTION 61%

[Series 2: axial soft tissue · axial · 0.63mm/px · z∈[-874,-459]mm · 13 of 91 slices shown, 15 images]
[im 4/91  soft-tissue]
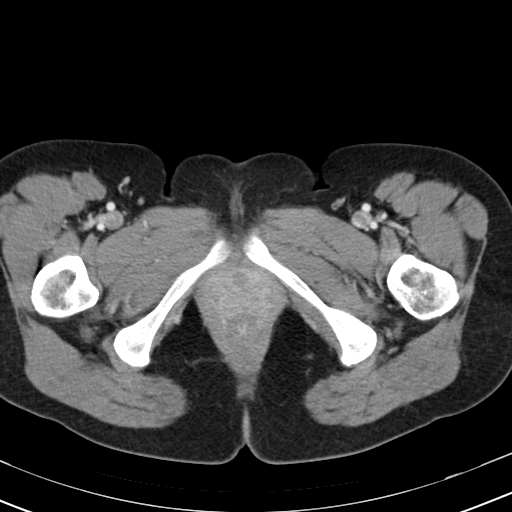
[im 4/91  bone]
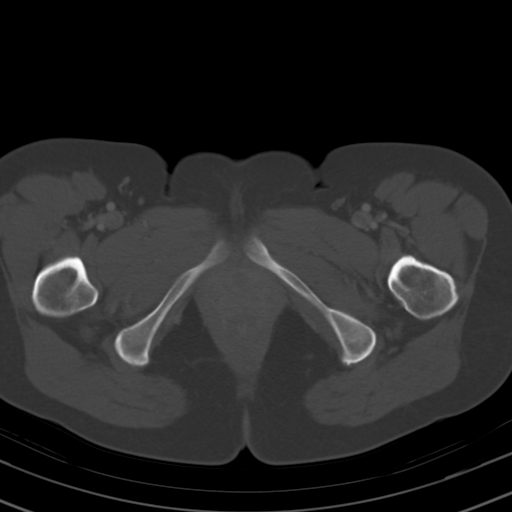
[im 12/91  soft-tissue]
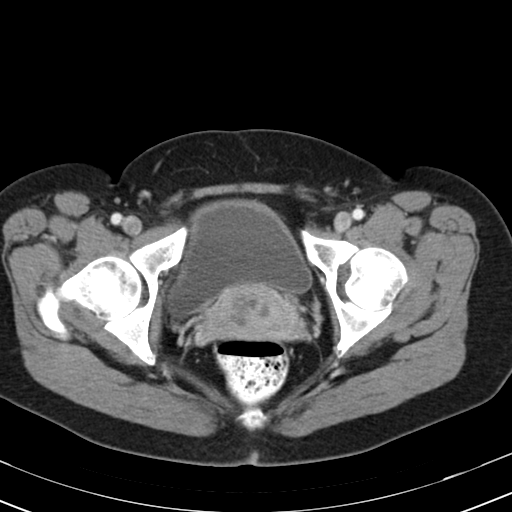
[im 20/91  soft-tissue]
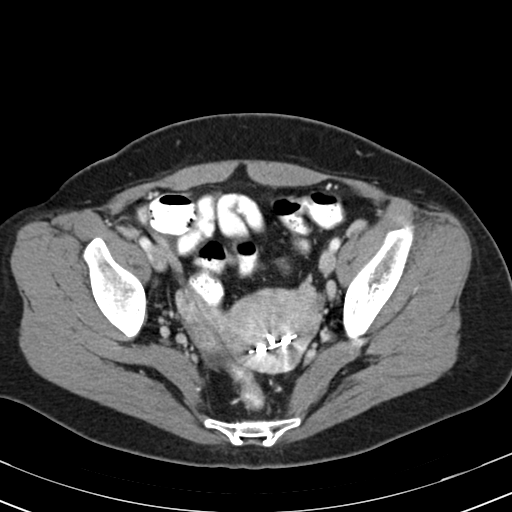
[im 24/91  soft-tissue]
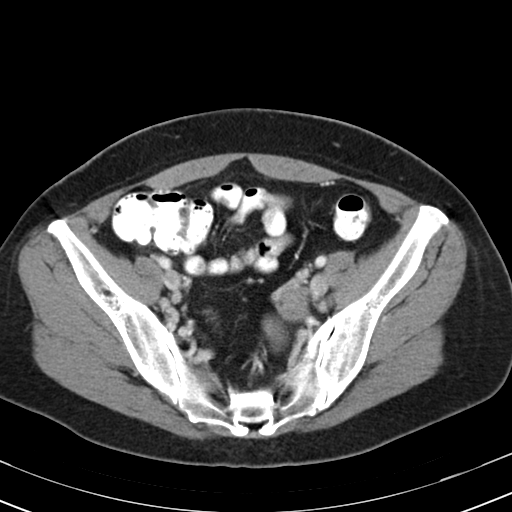
[im 32/91  soft-tissue]
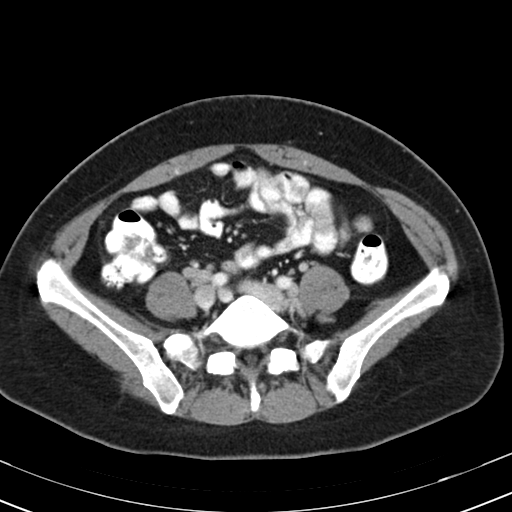
[im 40/91  soft-tissue]
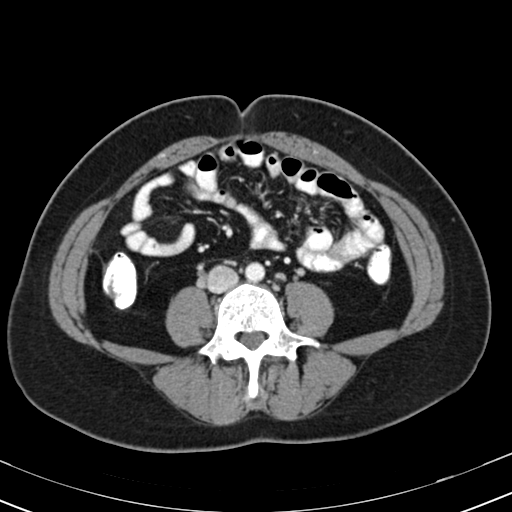
[im 47/91  soft-tissue]
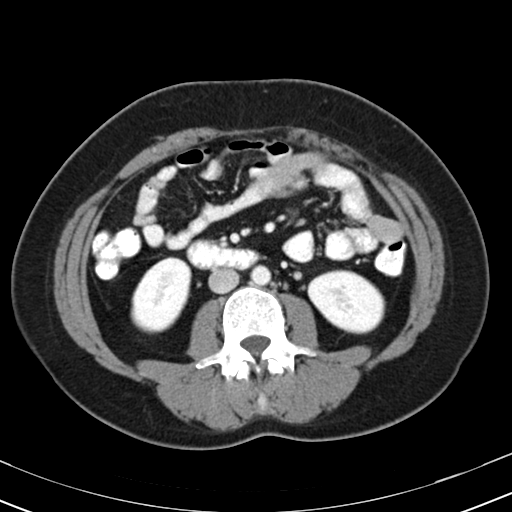
[im 51/91  soft-tissue]
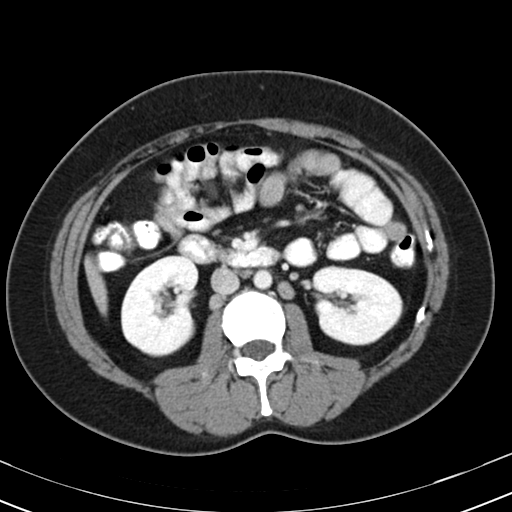
[im 59/91  soft-tissue]
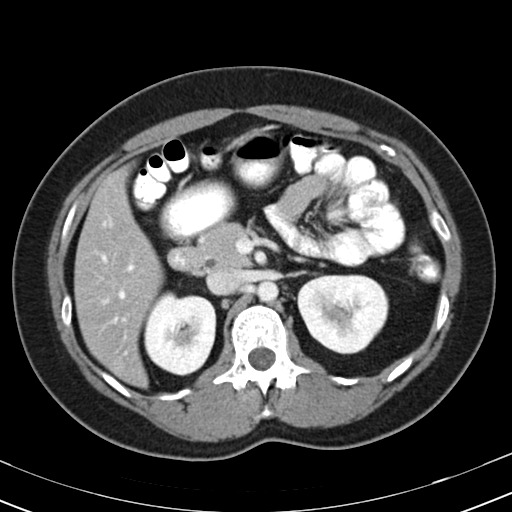
[im 59/91  bone]
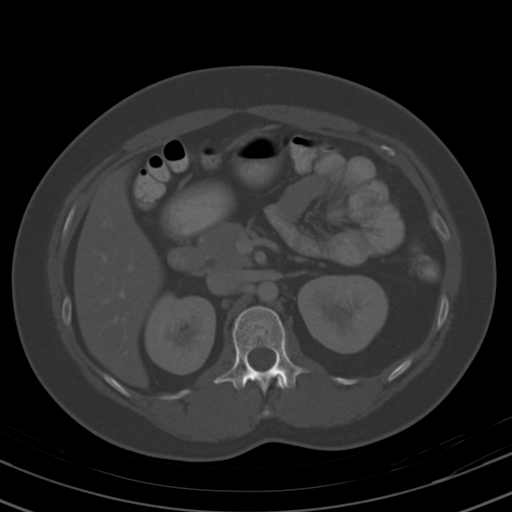
[im 67/91  soft-tissue]
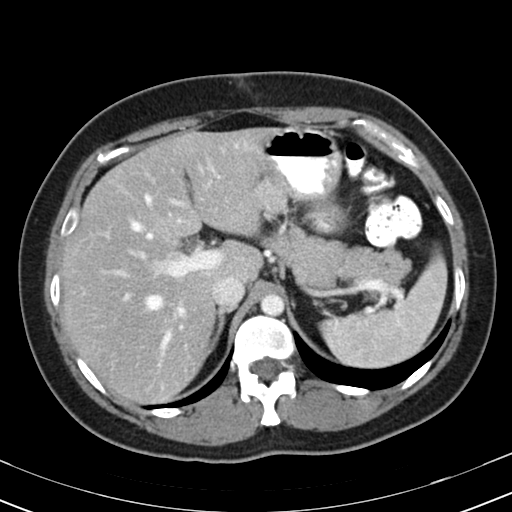
[im 71/91  soft-tissue]
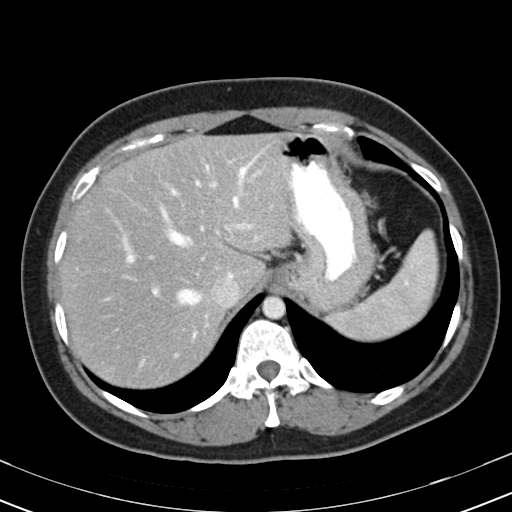
[im 79/91  soft-tissue]
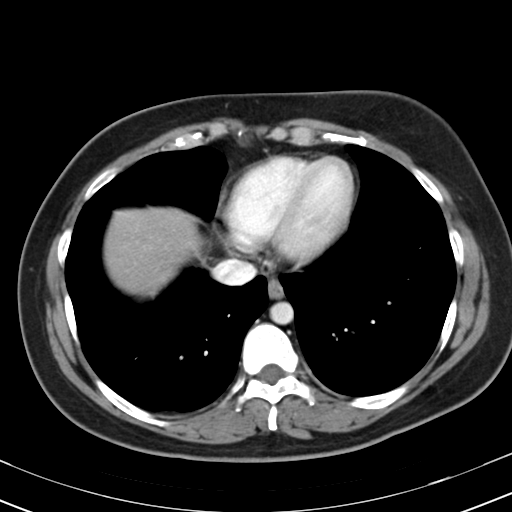
[im 87/91  soft-tissue]
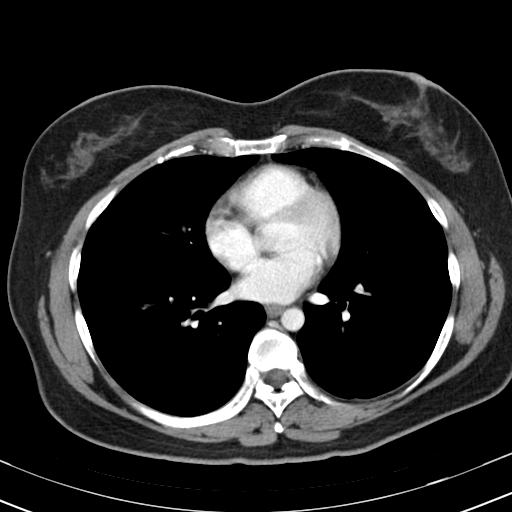

[Series 602: coronal · coronal · 0.89mm/px · 3 of 80 slices shown]
[im 27/80  soft-tissue]
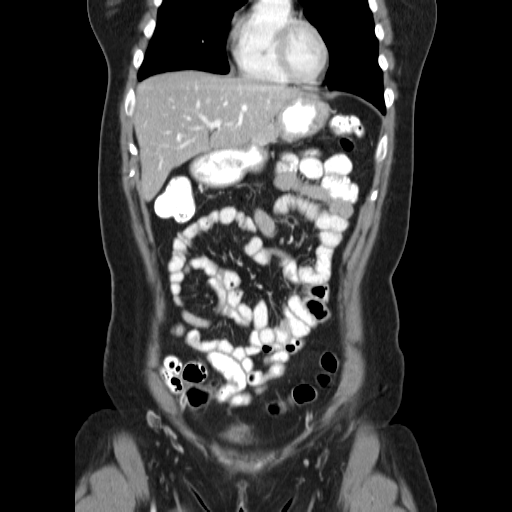
[im 36/80  soft-tissue]
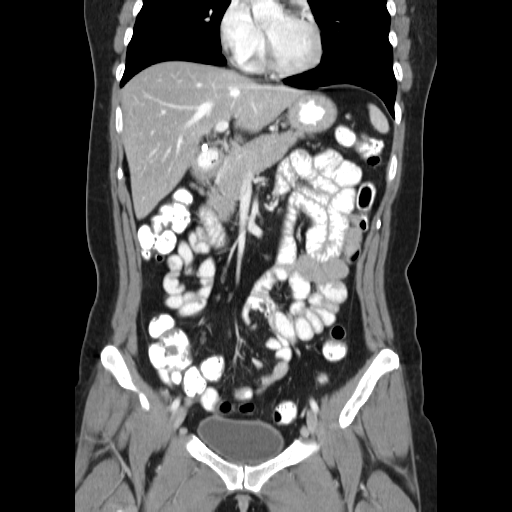
[im 44/80  soft-tissue]
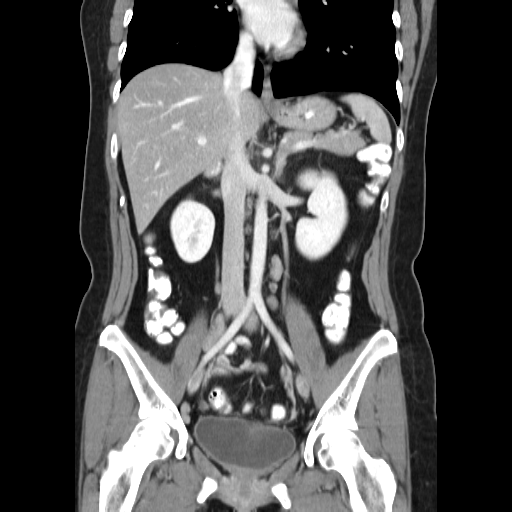

[16 of 46 positions shown; findings below may reference images not displayed]

FINDINGS: Lower chest:  No acute findings.

Hepatobiliary: Hepatic steatosis again noted. No liver masses
identified. Prior cholecystectomy again noted. No evidence of
biliary ductal dilatation.

Pancreas: No mass, inflammatory changes, or other significant
abnormality.

Spleen: Within normal limits in size and appearance.

Adrenals/Urinary Tract: No masses identified. No evidence of
hydronephrosis.

Stomach/Bowel: No evidence of obstruction, inflammatory process, or
abnormal fluid collections.

Vascular/Lymphatic: Soft tissue mass adjacent to the superior aspect
of the gastric body and gastrohepatic ligament measures 1.7 x 2.3 cm
on image 25/ series 2, and is not significantly changed in size
compared with prior studies. Other tiny less than 1 cm lymph nodes
in the gastrohepatic ligament are stable.

Mild abdominal retroperitoneal lymphadenopathy shows no significant
change compared to previous studies, with largest lymph node
measuring 9 mm in left paraaortic region on image 42/2 compared with
10 mm previously. Bilateral common and external iliac
lymphadenopathy also shows no significant change, with index lymph
node in the right external iliac chain measuring 10 mm on image 62/2
compared to 10 mm previously, and index lymph node in the left
external iliac chain measuring 9 mm on image 77/2 compared to 9 mm
previously.

No evidence of abdominal aortic aneurysm.

Reproductive: Retroverted uterus with IUD again seen is. Small right
ovarian corpus luteum cyst incidentally noted.

Other: None.

Musculoskeletal:  No suspicious bone lesions identified.
IMPRESSION: No significant interval change in soft tissue mass adjacent to the
distal stomach and gastrohepatic ligament. Differential diagnosis
includes lymphoproliferative disorder, GI stromal tumor, and
metastatic disease.

Stable mild retroperitoneal and pelvic lymphadenopathy.

No new or progressive disease identified within the abdomen or
pelvis.

## 2019-05-19 IMAGING — CR DG CHEST 2V
2 series · 2 of 2 positions shown · non-contrast
Comparison: Chest x-ray of September 25, 2006

CLINICAL DATA: One month of productive cough and right upper chest
discomfort. History of asthma and GIST. Nonsmoker.

EXAM:
CHEST  2 VIEW

[chest pa]
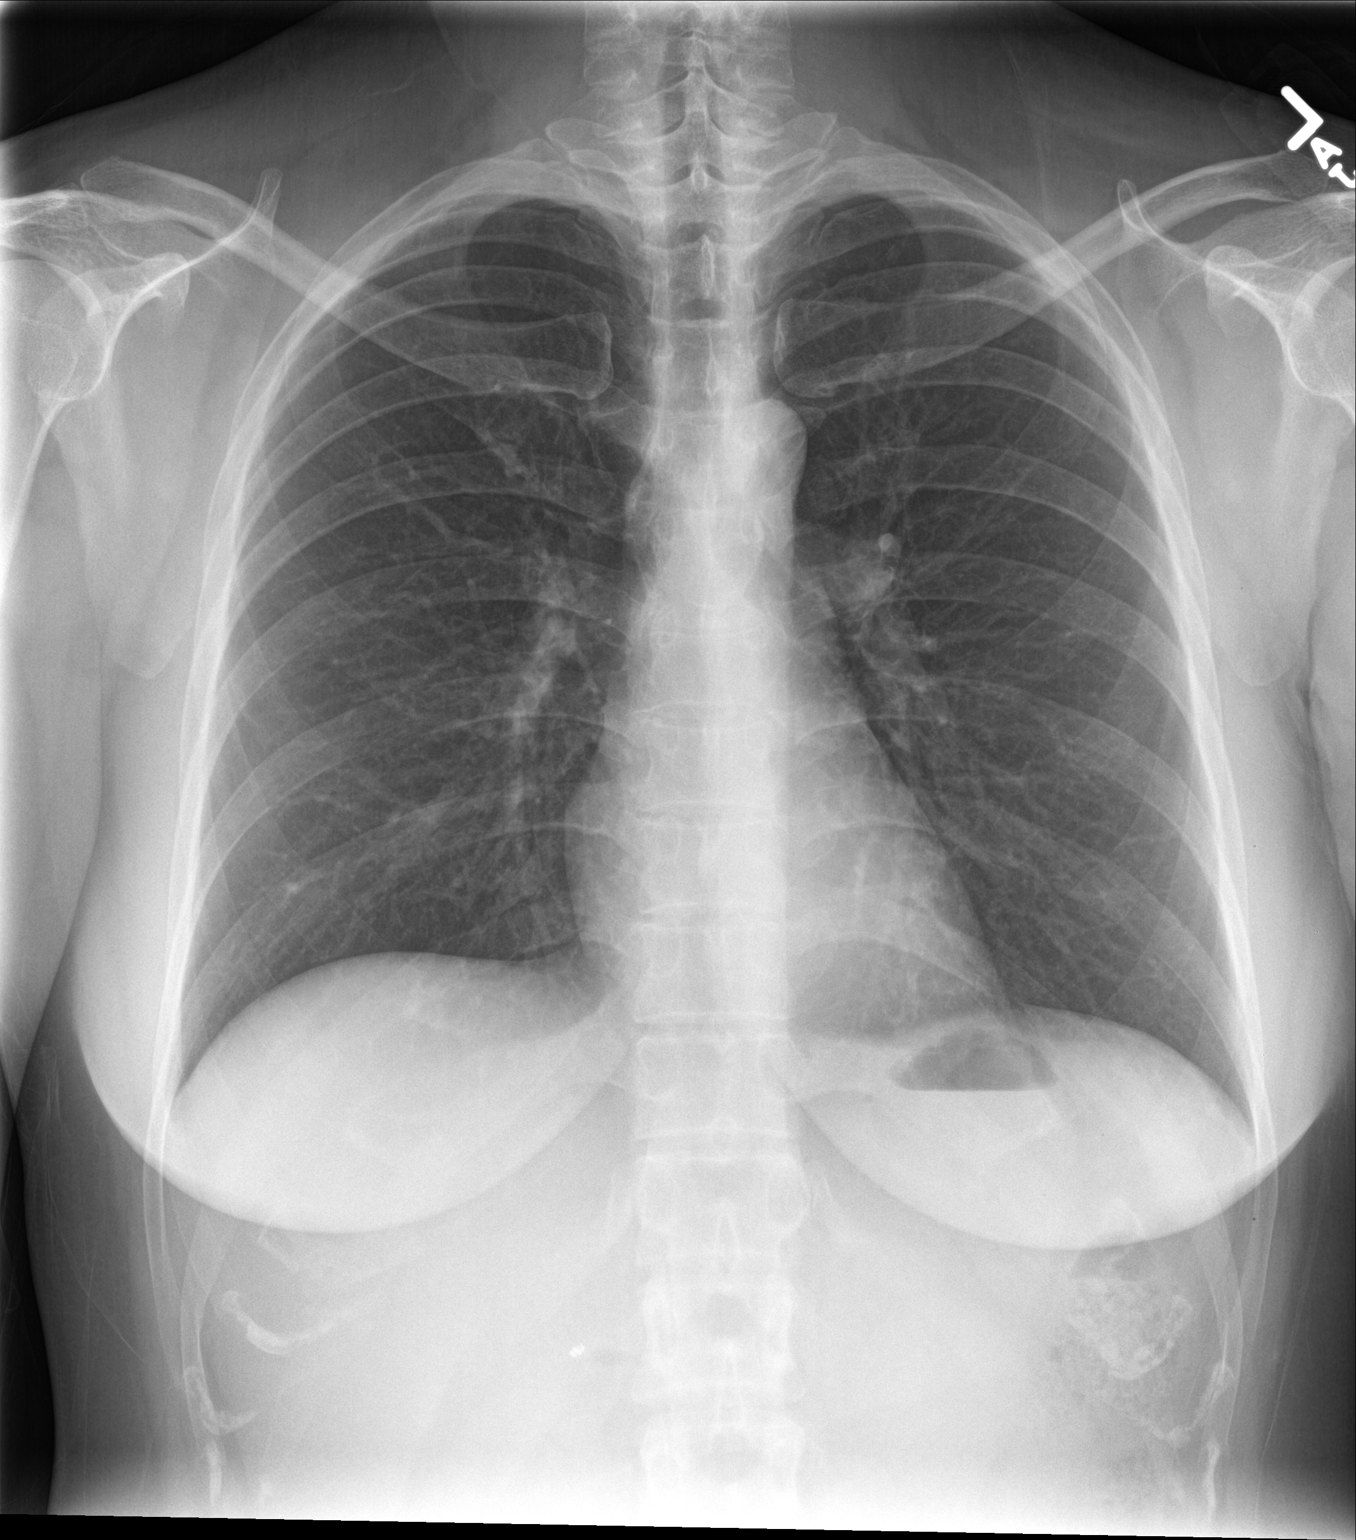

[chest lat]
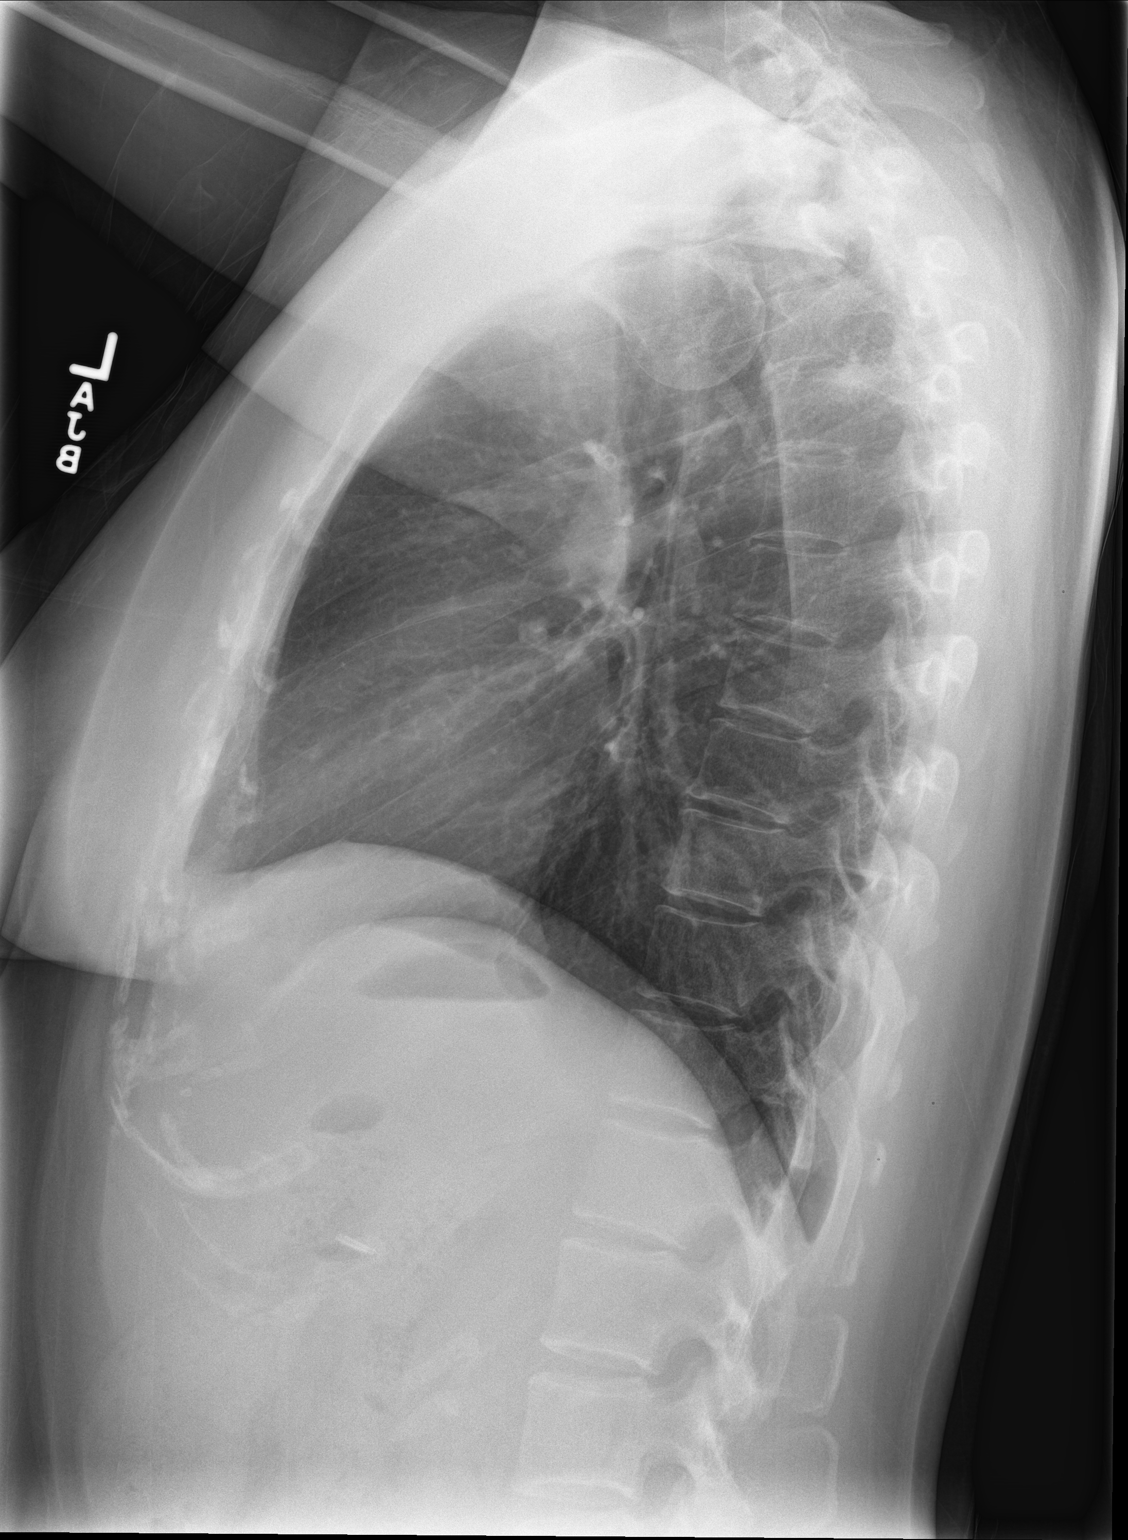

[2 of 2 positions shown; findings below may reference images not displayed]

FINDINGS: The lungs are well-expanded and clear. The heart and pulmonary
vascularity are normal. No pulmonary parenchymal nodules or masses
are observed the mediastinum is normal in width. The bony thorax is
unremarkable.
IMPRESSION: There is no active cardiopulmonary disease.
